# Patient Record
Sex: Female | Born: 1990 | Hispanic: Yes | Smoking: Never smoker
Health system: Southern US, Community
[De-identification: ages and names within clinical notes are randomized; demographics above are authoritative.]

## PROBLEM LIST (undated history)

## (undated) ENCOUNTER — Inpatient Hospital Stay (HOSPITAL_COMMUNITY): Payer: Self-pay

## (undated) DIAGNOSIS — E559 Vitamin D deficiency, unspecified: Secondary | ICD-10-CM

## (undated) DIAGNOSIS — Z789 Other specified health status: Secondary | ICD-10-CM

## (undated) DIAGNOSIS — E785 Hyperlipidemia, unspecified: Secondary | ICD-10-CM

## (undated) DIAGNOSIS — R519 Headache, unspecified: Secondary | ICD-10-CM

## (undated) HISTORY — DX: Vitamin D deficiency, unspecified: E55.9

## (undated) HISTORY — DX: Other specified health status: Z78.9

## (undated) HISTORY — DX: Headache, unspecified: R51.9

---
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
## 2011-07-06 ENCOUNTER — Other Ambulatory Visit: Payer: Self-pay | Admitting: Geriatric Medicine

## 2011-07-06 DIAGNOSIS — R109 Unspecified abdominal pain: Secondary | ICD-10-CM

## 2011-07-10 ENCOUNTER — Ambulatory Visit
Admission: RE | Admit: 2011-07-10 | Discharge: 2011-07-10 | Disposition: A | Discharge status: Home / Self Care | Payer: No Typology Code available for payment source | Source: Ambulatory Visit | Attending: Geriatric Medicine | Admitting: Geriatric Medicine

## 2011-07-10 DIAGNOSIS — R109 Unspecified abdominal pain: Secondary | ICD-10-CM

## 2012-04-29 ENCOUNTER — Encounter (HOSPITAL_COMMUNITY): Payer: Self-pay | Admitting: *Deleted

## 2012-04-29 ENCOUNTER — Inpatient Hospital Stay (HOSPITAL_COMMUNITY)
Admission: AD | Admit: 2012-04-29 | Discharge: 2012-04-29 | Disposition: A | Discharge status: Home / Self Care | Payer: Self-pay | Source: Ambulatory Visit | Attending: Obstetrics & Gynecology | Admitting: Obstetrics & Gynecology

## 2012-04-29 DIAGNOSIS — O209 Hemorrhage in early pregnancy, unspecified: Secondary | ICD-10-CM | POA: Insufficient documentation

## 2012-04-29 DIAGNOSIS — O43899 Other placental disorders, unspecified trimester: Secondary | ICD-10-CM

## 2012-04-29 DIAGNOSIS — O418X90 Other specified disorders of amniotic fluid and membranes, unspecified trimester, not applicable or unspecified: Secondary | ICD-10-CM

## 2012-04-29 LAB — WET PREP, GENITAL
Clue Cells Wet Prep HPF POC: NONE SEEN
Trich, Wet Prep: NONE SEEN
Yeast Wet Prep HPF POC: NONE SEEN

## 2012-04-29 MED ORDER — PRENATAL VITAMINS (DIS) PO TABS: 1.0000 | ORAL_TABLET | Freq: Every day | ORAL | Status: DC

## 2012-04-29 NOTE — MAU Note (Signed)
Pt had two large clots this morning when wiping, and is currently having bright red bleeding.

## 2012-04-29 NOTE — MAU Provider Note (Signed)
Attestation of Attending Supervision of Advanced Practitioner (CNM/NP): Evaluation and management procedures were performed by the Advanced Practitioner under my supervision and collaboration.  I have reviewed the Advanced Practitioner's note and chart, and I agree with the management and plan.  HARRAWAY-SMITH, Modupe Shampine 3:33 PM     

## 2012-04-29 NOTE — MAU Provider Note (Signed)
Chief Complaint:  Vaginal Bleeding    First Provider Initiated Contact with Patient 04/29/12 1024      [name] [name] is  21 y.o. G2P0010.  No LMP recorded. Patient is pregnant..  Her pregnancy status is positive.  [redacted]w[redacted]d She presents complaining of Vaginal Bleeding . Onset is described as sudden and has been present for  2 hours. Reports passing 2 quarter sized clots this morning and having continued light spotting. Reports mild lower abd cramping with passage of clots, none since. Starts PNC at [facility] next week. Last intercourse yesterday.  Obstetrical/Gynecological History: OB History    Grav Para Term Preterm Abortions TAB SAB Ect Mult Living   2 0 0 0 1  1   0      Past Medical History: History reviewed. No pertinent past medical history.  Past Surgical History: Past Surgical History  Procedure Date  . Dilation and curettage of uterus     Family History: History reviewed. No pertinent family history.  Social History: History  Substance Use Topics  . Smoking status: Never Smoker   . Smokeless tobacco: Not on file  . Alcohol Use: No    Allergies: No Known Allergies  No prescriptions prior to admission    Review of Systems - History obtained from the patient and through interpreptor, [name] [name] General ROS: negative for - chills or fever Breast ROS: negative Respiratory ROS: no cough, shortness of breath, or wheezing Cardiovascular ROS: no chest pain or dyspnea on exertion Gastrointestinal ROS: no abdominal pain, change in bowel habits, or black or bloody stools Genito-Urinary ROS: no dysuria, trouble voiding, or hematuria positive for - vaginal bleeding  Physical Exam   There were no vitals taken for this visit.  General: General appearance - alert, well appearing, and in no distress, oriented to person, place, and time and normal appearing weight Mental status - alert, oriented to person, place, and time, normal mood, behavior, speech, dress, motor activity,  and thought processes, affect appropriate to mood, comfortable appearing Abdomen - soft, nontender, nondistended, no masses or organomegaly Musculoskeletal - no joint tenderness, deformity or swelling, full range of motion without pain Extremities - peripheral pulses normal, no pedal edema, no clubbing or cyanosis Focused Gynecological Exam: VULVA: normal appearing vulva with no masses, tenderness or lesions, VAGINA: vaginal discharge - bloody, mucoid and scant, CERVIX: normal appearing cervix without discharge or lesions, closed/long/thick, UTERUS: enlarged to 12 week's size, ADNEXA: normal adnexa in size, nontender and no masses  Labs: Recent Results (from the past 24 hour(s))  WET PREP, GENITAL   Collection Time   04/29/12 10:35 AM      Component Value Range   Yeast Wet Prep HPF POC NONE SEEN  NONE SEEN   Trich, Wet Prep NONE SEEN  NONE SEEN   Clue Cells Wet Prep HPF POC NONE SEEN  NONE SEEN   WBC, Wet Prep HPF POC FEW (*) NONE SEEN   Imaging Studies:  Informal bedside US: + IUP with CRL c/w [redacted]w[redacted]d. Small SCH note 2.91cm in size. + Cardiac activity and fetal movement noted  Assessment: 1. Subchorionic hematoma, antepartum   2. Bleeding in early pregnancy     Plan: Discharge home Bleeding precautions FU as scheduled in [facility] next week.  [name] [name] E. 04/29/2012,11:13 AM

## 2012-04-30 LAB — GC/CHLAMYDIA PROBE AMP, GENITAL
Chlamydia, DNA Probe: NEGATIVE
GC Probe Amp, Genital: NEGATIVE

## 2012-05-16 ENCOUNTER — Other Ambulatory Visit (HOSPITAL_COMMUNITY): Payer: Self-pay | Admitting: Physician Assistant

## 2012-05-16 DIAGNOSIS — Z0489 Encounter for examination and observation for other specified reasons: Secondary | ICD-10-CM

## 2012-05-16 LAB — OB RESULTS CONSOLE RPR: RPR: NONREACTIVE

## 2012-05-16 LAB — OB RESULTS CONSOLE GC/CHLAMYDIA: Gonorrhea: NEGATIVE

## 2012-05-16 LAB — OB RESULTS CONSOLE ABO/RH: RH Type: POSITIVE

## 2012-06-06 ENCOUNTER — Ambulatory Visit (HOSPITAL_COMMUNITY)
Admission: RE | Admit: 2012-06-06 | Discharge: 2012-06-06 | Disposition: A | Discharge status: Home / Self Care | Payer: Medicaid Other | Source: Ambulatory Visit | Attending: Physician Assistant | Admitting: Physician Assistant

## 2012-06-06 ENCOUNTER — Other Ambulatory Visit (HOSPITAL_COMMUNITY): Payer: Self-pay | Admitting: Physician Assistant

## 2012-06-06 DIAGNOSIS — Z0489 Encounter for examination and observation for other specified reasons: Secondary | ICD-10-CM

## 2012-06-06 DIAGNOSIS — O358XX0 Maternal care for other (suspected) fetal abnormality and damage, not applicable or unspecified: Secondary | ICD-10-CM | POA: Insufficient documentation

## 2012-06-06 DIAGNOSIS — Z1389 Encounter for screening for other disorder: Secondary | ICD-10-CM | POA: Insufficient documentation

## 2012-06-06 DIAGNOSIS — Z363 Encounter for antenatal screening for malformations: Secondary | ICD-10-CM | POA: Insufficient documentation

## 2012-09-07 NOTE — L&D Delivery Note (Signed)
Delivery Note Pt progressed to complete and pushed well and at 4:42 PM a viable female was delivered via Vaginal, Spontaneous Delivery (Presentation: ROA  ).  APGAR: 9, ; weight: pending.  Pt dried and placed on pt's abd. Cord clamped and cut by FOB. Hospital cord blood sample collected. Placenta status: Intact, Spontaneous.  Cord: 3 vessels  Anesthesia: Epidural  Episiotomy: None Lacerations: 1st degree;Perineal Suture Repair: 3.0 vicryl Est. Blood Loss (mL): 350cc  Mom to postpartum.  Baby to nursery-stable.  Cam Hai 11/12/2012, 5:01 PM

## 2012-09-07 NOTE — L&D Delivery Note (Signed)
Attestation of Attending Supervision of Advanced Practitioner (CNM/NP): Evaluation and management procedures were performed by the Advanced Practitioner under my supervision and collaboration.  I have reviewed the Advanced Practitioner's note and chart, and I agree with the management and plan.  Tabytha Gradillas 11/16/2012 11:05 AM   

## 2012-09-29 ENCOUNTER — Other Ambulatory Visit (HOSPITAL_COMMUNITY): Payer: Self-pay | Admitting: Physician Assistant

## 2012-09-29 DIAGNOSIS — O365990 Maternal care for other known or suspected poor fetal growth, unspecified trimester, not applicable or unspecified: Secondary | ICD-10-CM

## 2012-09-30 ENCOUNTER — Ambulatory Visit (HOSPITAL_COMMUNITY)
Admission: RE | Admit: 2012-09-30 | Discharge: 2012-09-30 | Disposition: A | Discharge status: Home / Self Care | Payer: Self-pay | Source: Ambulatory Visit | Attending: Physician Assistant | Admitting: Physician Assistant

## 2012-09-30 DIAGNOSIS — O365990 Maternal care for other known or suspected poor fetal growth, unspecified trimester, not applicable or unspecified: Secondary | ICD-10-CM | POA: Insufficient documentation

## 2012-09-30 DIAGNOSIS — O26849 Uterine size-date discrepancy, unspecified trimester: Secondary | ICD-10-CM | POA: Insufficient documentation

## 2012-10-04 ENCOUNTER — Other Ambulatory Visit (HOSPITAL_COMMUNITY): Payer: Self-pay | Admitting: Physician Assistant

## 2012-10-04 DIAGNOSIS — O4190X0 Disorder of amniotic fluid and membranes, unspecified, unspecified trimester, not applicable or unspecified: Secondary | ICD-10-CM

## 2012-10-06 ENCOUNTER — Ambulatory Visit (HOSPITAL_COMMUNITY): Payer: Self-pay

## 2012-10-15 LAB — OB RESULTS CONSOLE GBS: GBS: NEGATIVE

## 2012-11-08 ENCOUNTER — Telehealth (HOSPITAL_COMMUNITY): Payer: Self-pay | Admitting: *Deleted

## 2012-11-08 ENCOUNTER — Encounter (HOSPITAL_COMMUNITY): Payer: Self-pay | Admitting: *Deleted

## 2012-11-08 NOTE — Telephone Encounter (Signed)
Preadmission screen Interpreter number [phone]

## 2012-11-09 ENCOUNTER — Telehealth (HOSPITAL_COMMUNITY): Payer: Self-pay | Admitting: *Deleted

## 2012-11-09 NOTE — Telephone Encounter (Signed)
Preadmission screen  

## 2012-11-11 ENCOUNTER — Other Ambulatory Visit: Payer: Medicaid Other

## 2012-11-12 ENCOUNTER — Inpatient Hospital Stay (HOSPITAL_COMMUNITY)
Admission: AD | Admit: 2012-11-12 | Discharge: 2012-11-14 | DRG: 775 | Disposition: A | Discharge status: Home / Self Care | Payer: Medicaid Other | Source: Ambulatory Visit | Attending: Obstetrics and Gynecology | Admitting: Obstetrics and Gynecology

## 2012-11-12 ENCOUNTER — Inpatient Hospital Stay (HOSPITAL_COMMUNITY): Payer: Medicaid Other | Admitting: Anesthesiology

## 2012-11-12 ENCOUNTER — Encounter (HOSPITAL_COMMUNITY): Payer: Self-pay | Admitting: *Deleted

## 2012-11-12 ENCOUNTER — Encounter (HOSPITAL_COMMUNITY): Payer: Self-pay | Admitting: Anesthesiology

## 2012-11-12 DIAGNOSIS — O365990 Maternal care for other known or suspected poor fetal growth, unspecified trimester, not applicable or unspecified: Secondary | ICD-10-CM | POA: Diagnosis present

## 2012-11-12 LAB — CBC
HCT: 35.4 % — ABNORMAL LOW (ref 36.0–46.0)
Hemoglobin: 12.1 g/dL (ref 12.0–15.0)
MCH: 32.4 pg (ref 26.0–34.0)
MCHC: 34.2 g/dL (ref 30.0–36.0)
MCV: 94.7 fL (ref 78.0–100.0)
RBC: 3.74 MIL/uL — ABNORMAL LOW (ref 3.87–5.11)

## 2012-11-12 LAB — ABO/RH: ABO/RH(D): A POS

## 2012-11-12 MED ORDER — OXYTOCIN BOLUS FROM INFUSION: 500.0000 mL | INTRAVENOUS | Status: DC

## 2012-11-12 MED ORDER — SIMETHICONE 80 MG PO CHEW: 80.0000 mg | CHEWABLE_TABLET | ORAL | Status: DC | PRN

## 2012-11-12 MED ORDER — CITRIC ACID-SODIUM CITRATE 334-500 MG/5ML PO SOLN: 30.0000 mL | ORAL | Status: DC | PRN

## 2012-11-12 MED ORDER — ACETAMINOPHEN 325 MG PO TABS: 650.0000 mg | ORAL_TABLET | ORAL | Status: DC | PRN

## 2012-11-12 MED ORDER — WITCH HAZEL-GLYCERIN EX PADS: 1.0000 "application " | MEDICATED_PAD | CUTANEOUS | Status: DC | PRN

## 2012-11-12 MED ORDER — SODIUM BICARBONATE 8.4 % IV SOLN
INTRAVENOUS | Status: DC | PRN
  Administered 2012-11-12: 4 mL via EPIDURAL

## 2012-11-12 MED ORDER — OXYCODONE-ACETAMINOPHEN 5-325 MG PO TABS: 1.0000 | ORAL_TABLET | ORAL | Status: DC | PRN

## 2012-11-12 MED ORDER — FENTANYL 2.5 MCG/ML BUPIVACAINE 1/10 % EPIDURAL INFUSION (WH - ANES)
14.0000 mL/h | INTRAMUSCULAR | Status: DC
  Administered 2012-11-12: 12 mL/h via EPIDURAL
  Filled 2012-11-12: qty 125

## 2012-11-12 MED ORDER — LIDOCAINE HCL (PF) 1 % IJ SOLN
30.0000 mL | INTRAMUSCULAR | Status: DC | PRN
  Filled 2012-11-12: qty 30

## 2012-11-12 MED ORDER — LACTATED RINGERS IV SOLN
500.0000 mL | Freq: Once | INTRAVENOUS | Status: AC
  Administered 2012-11-12: 500 mL via INTRAVENOUS

## 2012-11-12 MED ORDER — BENZOCAINE-MENTHOL 20-0.5 % EX AERO: 1.0000 "application " | INHALATION_SPRAY | CUTANEOUS | Status: DC | PRN

## 2012-11-12 MED ORDER — ONDANSETRON HCL 4 MG/2ML IJ SOLN: 4.0000 mg | Freq: Four times a day (QID) | INTRAMUSCULAR | Status: DC | PRN

## 2012-11-12 MED ORDER — PHENYLEPHRINE 40 MCG/ML (10ML) SYRINGE FOR IV PUSH (FOR BLOOD PRESSURE SUPPORT): 80.0000 ug | PREFILLED_SYRINGE | INTRAVENOUS | Status: DC | PRN

## 2012-11-12 MED ORDER — DIPHENHYDRAMINE HCL 50 MG/ML IJ SOLN: 12.5000 mg | INTRAMUSCULAR | Status: DC | PRN

## 2012-11-12 MED ORDER — LANOLIN HYDROUS EX OINT: TOPICAL_OINTMENT | CUTANEOUS | Status: DC | PRN

## 2012-11-12 MED ORDER — LACTATED RINGERS IV SOLN
INTRAVENOUS | Status: DC
  Administered 2012-11-12: 13:00:00 via INTRAVENOUS

## 2012-11-12 MED ORDER — ZOLPIDEM TARTRATE 5 MG PO TABS: 5.0000 mg | ORAL_TABLET | Freq: Every evening | ORAL | Status: DC | PRN

## 2012-11-12 MED ORDER — PHENYLEPHRINE 40 MCG/ML (10ML) SYRINGE FOR IV PUSH (FOR BLOOD PRESSURE SUPPORT)
80.0000 ug | PREFILLED_SYRINGE | INTRAVENOUS | Status: DC | PRN
  Filled 2012-11-12: qty 5

## 2012-11-12 MED ORDER — TETANUS-DIPHTH-ACELL PERTUSSIS 5-2.5-18.5 LF-MCG/0.5 IM SUSP: 0.5000 mL | Freq: Once | INTRAMUSCULAR | Status: DC

## 2012-11-12 MED ORDER — EPHEDRINE 5 MG/ML INJ: 10.0000 mg | INTRAVENOUS | Status: DC | PRN

## 2012-11-12 MED ORDER — SENNOSIDES-DOCUSATE SODIUM 8.6-50 MG PO TABS
2.0000 | ORAL_TABLET | Freq: Every day | ORAL | Status: DC
  Administered 2012-11-12: 2 via ORAL

## 2012-11-12 MED ORDER — EPHEDRINE 5 MG/ML INJ
10.0000 mg | INTRAVENOUS | Status: DC | PRN
  Filled 2012-11-12: qty 4

## 2012-11-12 MED ORDER — FLEET ENEMA 7-19 GM/118ML RE ENEM: 1.0000 | ENEMA | RECTAL | Status: DC | PRN

## 2012-11-12 MED ORDER — PRENATAL MULTIVITAMIN CH
1.0000 | ORAL_TABLET | Freq: Every day | ORAL | Status: DC
  Administered 2012-11-13 – 2012-11-14 (×2): 1 via ORAL
  Filled 2012-11-12 (×2): qty 1

## 2012-11-12 MED ORDER — ONDANSETRON HCL 4 MG PO TABS: 4.0000 mg | ORAL_TABLET | ORAL | Status: DC | PRN

## 2012-11-12 MED ORDER — IBUPROFEN 600 MG PO TABS
600.0000 mg | ORAL_TABLET | Freq: Four times a day (QID) | ORAL | Status: DC
  Administered 2012-11-12 – 2012-11-14 (×8): 600 mg via ORAL
  Filled 2012-11-12 (×8): qty 1

## 2012-11-12 MED ORDER — ONDANSETRON HCL 4 MG/2ML IJ SOLN: 4.0000 mg | INTRAMUSCULAR | Status: DC | PRN

## 2012-11-12 MED ORDER — IBUPROFEN 600 MG PO TABS: 600.0000 mg | ORAL_TABLET | Freq: Four times a day (QID) | ORAL | Status: DC | PRN

## 2012-11-12 MED ORDER — OXYTOCIN 40 UNITS IN LACTATED RINGERS INFUSION - SIMPLE MED
62.5000 mL/h | INTRAVENOUS | Status: DC
  Administered 2012-11-12: 62.5 mL/h via INTRAVENOUS
  Filled 2012-11-12: qty 1000

## 2012-11-12 MED ORDER — DIPHENHYDRAMINE HCL 25 MG PO CAPS: 25.0000 mg | ORAL_CAPSULE | Freq: Four times a day (QID) | ORAL | Status: DC | PRN

## 2012-11-12 MED ORDER — DIBUCAINE 1 % RE OINT: 1.0000 "application " | TOPICAL_OINTMENT | RECTAL | Status: DC | PRN

## 2012-11-12 MED ORDER — LACTATED RINGERS IV SOLN: 500.0000 mL | INTRAVENOUS | Status: DC | PRN

## 2012-11-12 NOTE — Progress Notes (Signed)
Lab in to redraw Type&Screen. Previous sample hemolyzed.

## 2012-11-12 NOTE — H&P (Signed)
[name] [name] is a 22 y.o. female G2P0010 at 41.0 presenting for contractions and leaking fluid. Denies fever, chills, nausea, vomiting, headache. Is having some bloody, watery vaginal discharge she is concerned is her membranes rupturing.  [facility] Pregnancy uncomplicated except for size less than dates, ultrasound on 09/30/12 at 32 weeks showed growth to be in 48% with no follow up needed.    Maternal Medical History:  Reason for admission: Contractions.  Nausea.  Contractions: Onset was 3-5 hours ago.   Frequency: regular.   Duration is approximately 1 minute.   Perceived severity is moderate.    Fetal activity: Perceived fetal activity is normal.   Last perceived fetal movement was within the past hour.    Prenatal complications: No hypertension, pre-eclampsia, preterm labor or substance abuse.   Prenatal Complications - Diabetes: none.    OB History   Grav Para Term Preterm Abortions TAB SAB Ect Mult Living   2 0 0 0 1  1   0     Past Medical History  Diagnosis Date  . Medical history non-contributory    Past Surgical History  Procedure Laterality Date  . Dilation and curettage of uterus     Family History: family history includes Heart disease in her father. Social History:  reports that she has never smoked. She has never used smokeless tobacco. She reports that she does not drink alcohol or use illicit drugs.   Prenatal Transfer Tool  Maternal Diabetes: No Genetic Screening: Normal Maternal Ultrasounds/Referrals: Normal Fetal Ultrasounds or other Referrals:  None Maternal Substance Abuse:  No Significant Maternal Medications:  None Significant Maternal Lab Results:  Lab values include: Group B Strep negative Other Comments:  ultrasound at 32 weeks showed growth in 48th percentile  Review of Systems  Constitutional: Negative for fever and chills.  Eyes: Negative for blurred vision.  Gastrointestinal: Positive for  abdominal pain. Negative for nausea and vomiting.  Genitourinary: Negative for dysuria.  Neurological: Negative for headaches.    Dilation: 4 Effacement (%): 100 Station: -1 Exam by:: [employer] CNM student Blood pressure 125/63, pulse 84, temperature 97.2 F (36.2 C), temperature source Oral, resp. rate 18, height 5' (1.524 m), weight 54.25 kg (119 lb 9.6 oz). Maternal Exam:  Uterine Assessment: Contraction strength is moderate.  Contraction duration is 1 minute. Contraction frequency is regular.   Abdomen: Fetal presentation: vertex  Introitus: Vagina is positive for vaginal discharge.  Ferning test: negative.   Pelvis: adequate for delivery.   Cervix: Cervix evaluated by sterile speculum exam and digital exam.     Fetal Exam Fetal Monitor Review: Mode: ultrasound.   Baseline rate: 135.  Variability: moderate (6-25 bpm).   Pattern: accelerations present.    Fetal State Assessment: Category I - tracings are normal.     Physical Exam  Constitutional: She is oriented to person, place, and time. She appears well-developed and well-nourished. She appears distressed.  Cardiovascular: Normal rate and regular rhythm.   Respiratory: Effort normal and breath sounds normal. No respiratory distress.  GI: Soft.  Genitourinary: Vaginal discharge found.  4/100/-1   Musculoskeletal: Normal range of motion. She exhibits no edema.  Neurological: She is alert and oriented to person, place, and time.  Psychiatric: She has a normal mood and affect. Her behavior is normal.    Prenatal labs: ABO, Rh: A/Positive/-- (09/09 0000) Antibody: Negative (09/09 0000) Rubella: Immune (09/09 0000) RPR: Nonreactive (09/09 0000)  HBsAg: Negative (09/09 0000)  HIV: Non-reactive (09/09  0000)  GBS: Negative (02/08 0000)   Assessment/Plan: 1. G2P0010 at 41.0 in active labor 2. GBS negative 3. Membranes intact with normal bloody show  -admit to labor and delivery -may have epidural as  desired    Alene Mires 11/12/2012, 8:04 AM  I have seen and examined this patient and I agree with the above. SHAW, KIMBERLY 11:06 AM 11/12/2012

## 2012-11-12 NOTE — Anesthesia Procedure Notes (Signed)
Epidural Patient location during procedure: OB  Preanesthetic Checklist Completed: patient identified, site marked, surgical consent, pre-op evaluation, timeout performed, IV checked, risks and benefits discussed and monitors and equipment checked  Epidural Patient position: sitting Prep: site prepped and draped and DuraPrep Patient monitoring: continuous pulse ox and blood pressure Approach: midline Injection technique: LOR air  Needle:  Needle type: Tuohy  Needle gauge: 17 G Needle length: 9 cm and 9 Needle insertion depth: 4 cm Catheter type: closed end flexible Catheter size: 19 Gauge Catheter at skin depth: 10 cm Test dose: negative  Assessment Events: blood not aspirated, injection not painful, no injection resistance, negative IV test and no paresthesia  Additional Notes Dosing of Epidural:  1st dose, through catheter ............................................. epi 1:200K + Xylocaine 40 mg  2nd dose, through catheter, after waiting 3 minutes.....epi 1:200K + Xylocaine 40 mg   ( 2% Xylo charted as a single dose in Epic Meds for ease of charting; actual dosing was fractionated as above, for saftey's sake)  As each dose occurred, patient was free of IV sx; and patient exhibited no evidence of SA injection.  Patient is more comfortable after epidural dosed. Please see RN's note for documentation of vital signs,and FHR which are stable.  Patient reminded not to try to ambulate with numb legs, and that an RN must be present the 1st time she attempts to get up.    

## 2012-11-12 NOTE — Progress Notes (Signed)
[name] [name] is a 22 y.o. G2P0010 at [redacted]w[redacted]d   Subjective: Comfortable with epidural but feeling a little pressure  Objective: BP 103/59  Pulse 87  Temp(Src) 98 F (36.7 C) (Oral)  Resp 20  Ht 5' (1.524 m)  Wt 119 lb 9.6 oz (54.25 kg)  BMI 23.36 kg/m2  SpO2 100%      FHT:  FHR: 125 bpm, variability: moderate,  accelerations:  Present,  decelerations:  Absent UC:   regular, every 3-4 minutes, spontaneously SVE:   Dilation: 9 Effacement (%): 100 Station: +1 Exam by:: J.Cox, RN  Labs: Lab Results  Component Value Date   WBC 8.8 11/12/2012   HGB 12.1 11/12/2012   HCT 35.4* 11/12/2012   MCV 94.7 11/12/2012   PLT 167 11/12/2012    Assessment / Plan: Active labor/transiton  Will reexamine cx in 1-2 hours or sooner with increased pressure  SHAW, KIMBERLY 11/12/2012, 2:38 PM

## 2012-11-12 NOTE — MAU Note (Signed)
Pt presents with complaints or leakage of fluid and contractions that started at 6am. States contractions are 5 mins apart.

## 2012-11-12 NOTE — Anesthesia Preprocedure Evaluation (Addendum)

## 2012-11-13 NOTE — Anesthesia Postprocedure Evaluation (Signed)
Anesthesia Post Note  Patient: @[name] [name] @[name] [name]   Procedure(s) Performed: CLE/C/S  Anesthesia type: Epidural  Patient location: Mother/Baby  Post pain: Pain level controlled  Post assessment: Post-op Vital signs reviewed  Last Vitals: BP 105/55  Pulse 67  Temp(Src) 36.8 C (Oral)  Resp 18  Ht 5' (1.524 m)  Wt 119 lb 9.6 oz (54.25 kg)  BMI 23.36 kg/m2  SpO2 100%  Post vital signs: Reviewed  Level of consciousness: awake  Complications: No apparent anesthesia complications

## 2012-11-13 NOTE — Discharge Summary (Signed)
Obstetric Discharge Summary Reason for Admission: onset of labor Prenatal Procedures: ultrasound Intrapartum Procedures: spontaneous vaginal delivery Postpartum Procedures: none Complications-Operative and Postpartum: none Hemoglobin  Date Value Range Status  11/12/2012 12.1  12.0 - 15.0 g/dL Final     HCT  Date Value Range Status  11/12/2012 35.4* 36.0 - 46.0 % Final    Physical Exam:  General: alert, cooperative and no distress Lochia: appropriate Uterine Fundus: firm DVT Evaluation: No evidence of DVT seen on physical exam.  Discharge Diagnoses: Term Pregnancy-delivered  Discharge Information: Date: 11/13/2012 Activity: pelvic rest Diet: routine Medications: None Condition: stable Instructions: refer to practice specific booklet Discharge to: home   Newborn Data: Live born female  Birth Weight: 6 lb 12.1 oz (3065 g) APGAR: 9, 9  Home with mother.  [name] [name],[name] Roller 11/13/2012, 9:44 AM

## 2012-11-14 ENCOUNTER — Other Ambulatory Visit: Payer: Medicaid Other

## 2012-11-14 LAB — RPR: RPR Ser Ql: NONREACTIVE

## 2012-11-14 MED ORDER — OXYCODONE-ACETAMINOPHEN 5-325 MG PO TABS: 1.0000 | ORAL_TABLET | ORAL | Status: DC | PRN

## 2012-11-14 MED ORDER — NORETHINDRONE 0.35 MG PO TABS: 1.0000 | ORAL_TABLET | Freq: Every day | ORAL | Status: DC

## 2012-11-14 NOTE — Progress Notes (Signed)
UR chart review completed.  

## 2012-11-14 NOTE — Lactation Note (Signed)
This note was copied from the chart of [name] [name]. Mom states br feeding is going very well. Mom denies pain. Mom currently feeding baby without assistance, LS = 10. Instructed mom and FOB in the prevention and treatment of engorgement and sore nipples. Enc mom to call lactation office if she has any concerns, and to attend the BFSG. Questions answered. Debbie, interpreter, present for consult.

## 2012-11-14 NOTE — Progress Notes (Signed)
Post Partum Day 2  Subjective: Interview conducted with Spanish interpreter no complaints, up ad lib, voiding, tolerating PO, + flatus and bowel movements. Plans on using OCPs for contraception, would like for [place] to prescribe. [facility] Breastfeeding.  Objective: Blood pressure 101/67, pulse 77, temperature 98.2 F (36.8 C), temperature source Oral, resp. rate 18, height 5' (1.524 m), weight 54.25 kg (119 lb 9.6 oz), SpO2 100.00%, unknown if currently breastfeeding.  Physical Exam:  General: alert, cooperative, appears stated age, no distress and pleasant Uterine Fundus: firm DVT Evaluation: No evidence of DVT seen on physical exam. Negative Homan's sign. No cords or calf tenderness. Heart and lungs clear to auscultation   Recent Labs  11/12/12 0835  HGB 12.1  HCT 35.4*    Assessment/Plan: Discharge home Prescription for progesterone-only OCPs sent.   LOS: 2 days   Lorna Dibble, PA-S 11/14/2012, 7:58 AM

## 2012-11-15 ENCOUNTER — Inpatient Hospital Stay (HOSPITAL_COMMUNITY): Admission: RE | Admit: 2012-11-15 | Payer: Medicaid Other | Source: Ambulatory Visit

## 2012-11-17 ENCOUNTER — Ambulatory Visit (HOSPITAL_COMMUNITY): Admission: RE | Admit: 2012-11-17 | Payer: MEDICAID | Source: Ambulatory Visit

## 2012-12-18 IMAGING — US US OB DETAIL+14 WK
2 series · 12 of 28 positions shown · non-contrast
Comparison: none

[Series 1: us ob detail +14 wk · 96 acquisitions, 11 frames shown (1 of 2)]
[im 4/96]
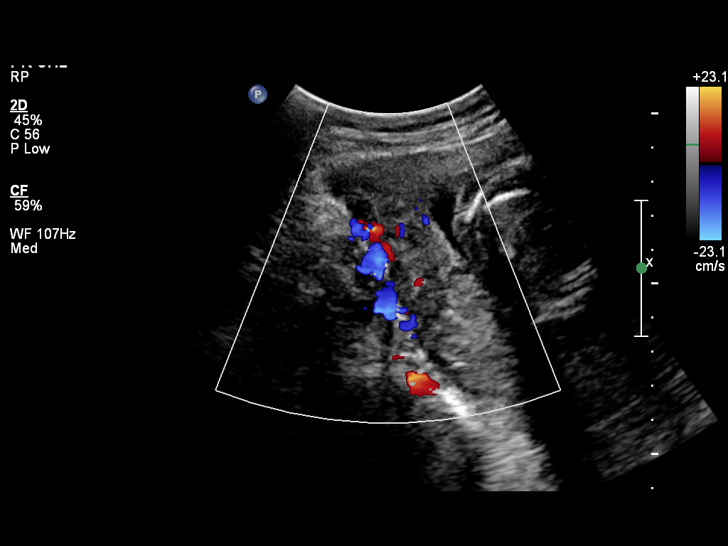
[im 12/96]
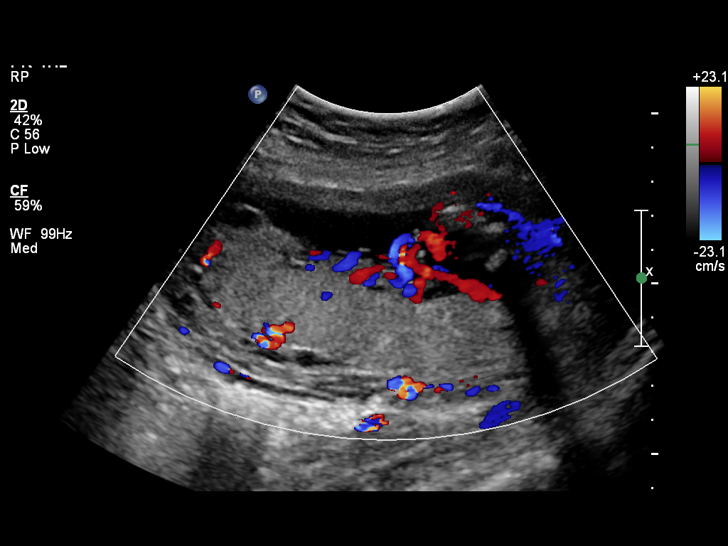
[im 20/96]
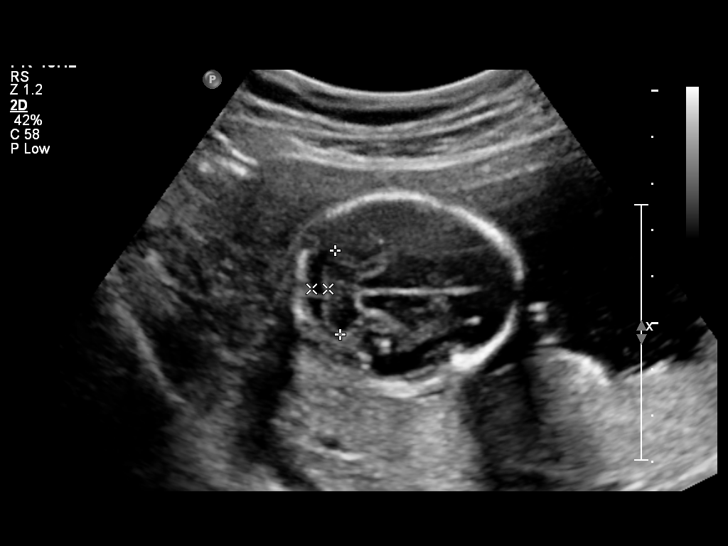
[im 31/96]
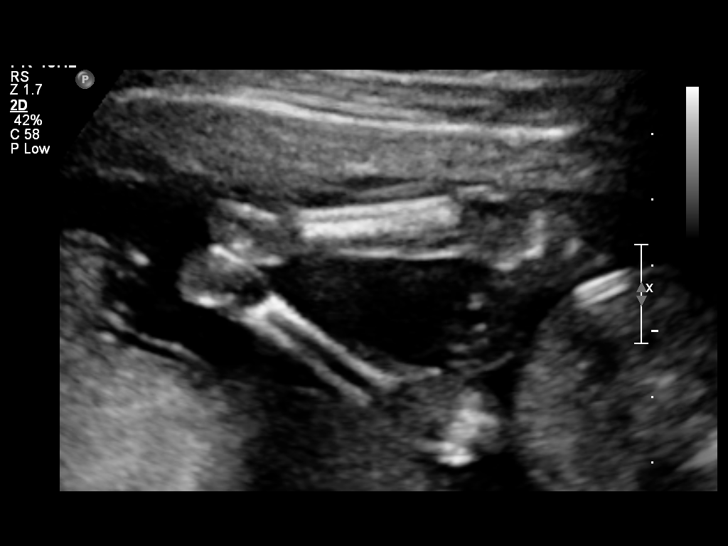
[im 39/96]
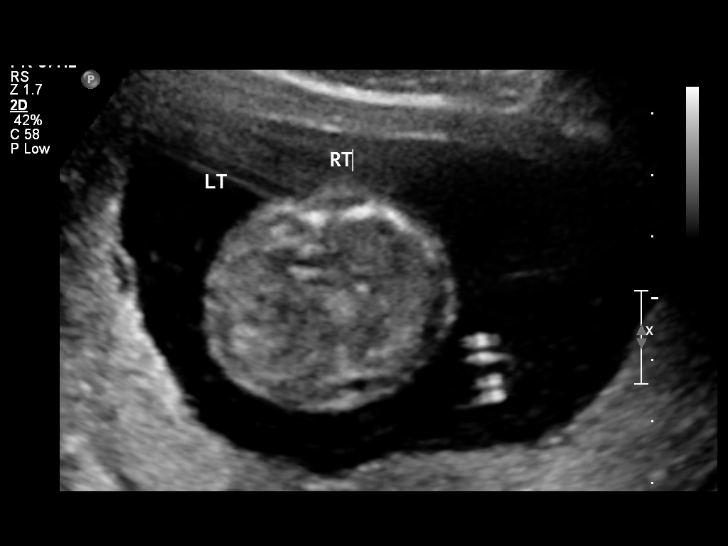
[im 46/96]
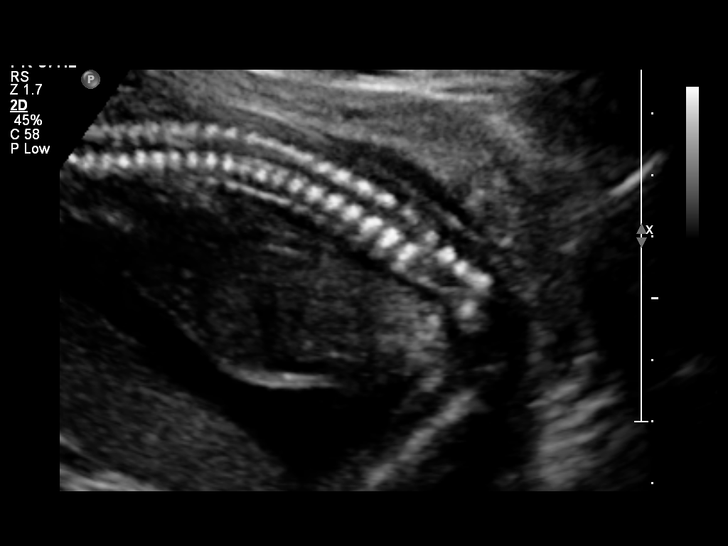
[im 58/96]
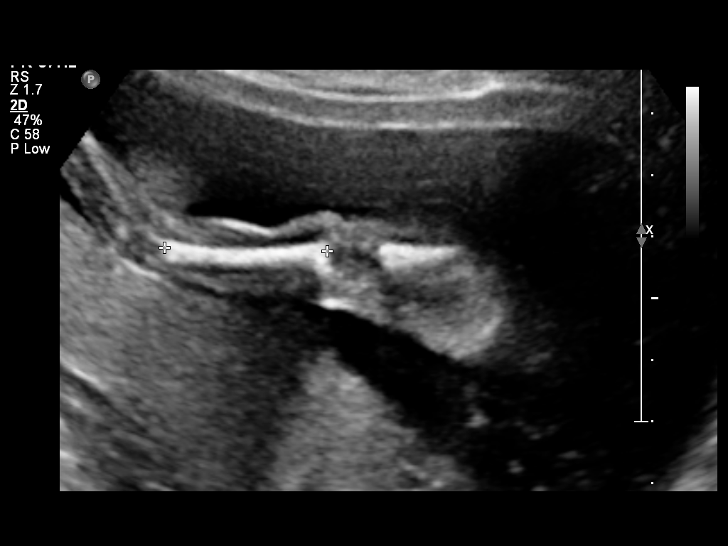
[im 65/96]
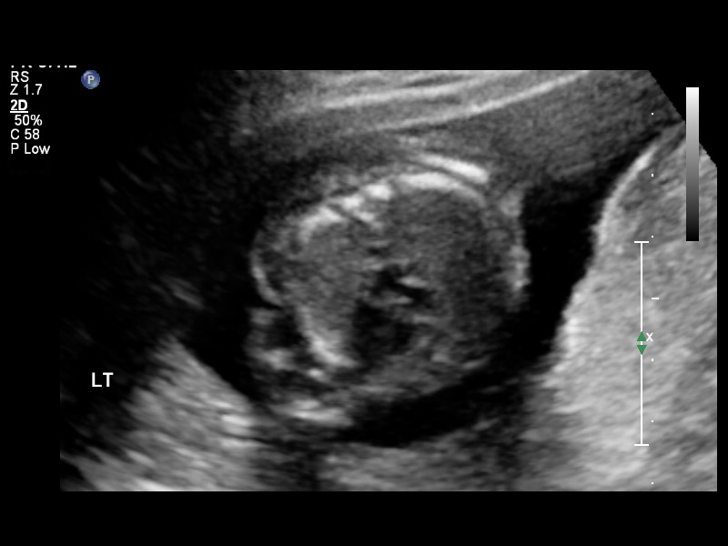
[im 73/96]
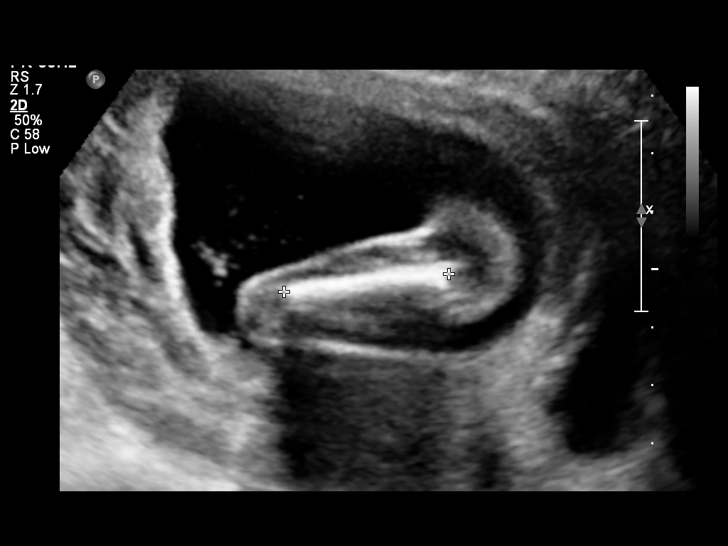
[im 84/96]
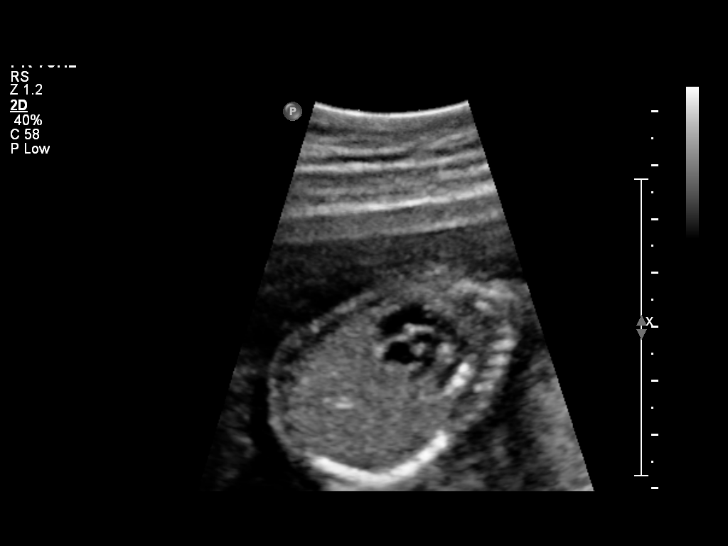
[im 92/96]
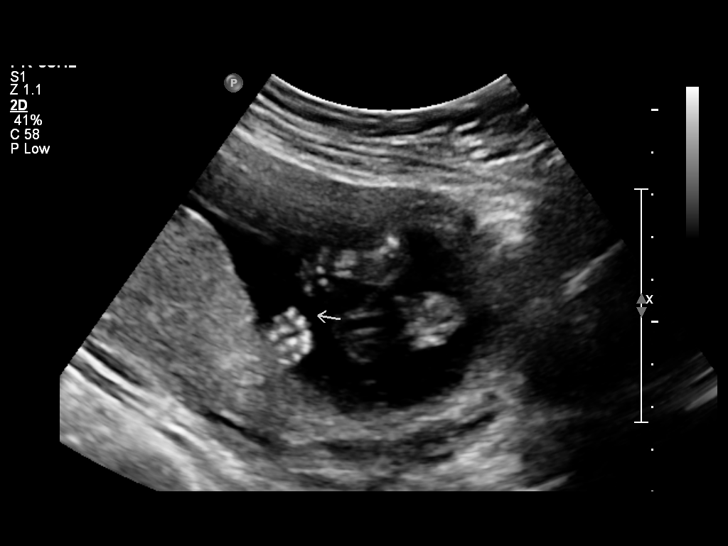

[Series 1: us ob detail +14 wk · 1 of 8 slices shown (2 of 2)]
[im 1/8]
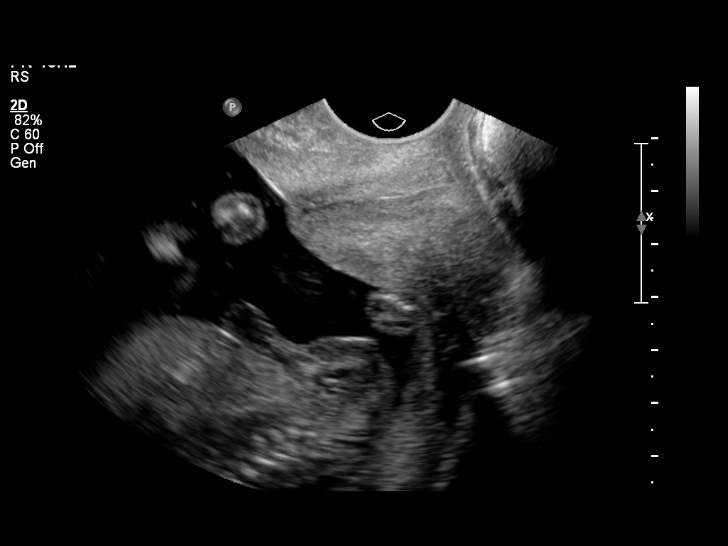

[12 of 28 positions shown; findings below may reference images not displayed]

OBSTETRICS REPORT
                    (Corrected Final 06/13/2012 [DATE])

Service(s) Provided

 US OB DETAIL + 14 WK                                  76811.0
 US OB TRANSVAGINAL                                    76817.0
Indications

 Detailed fetal anatomic survey

Fetal Evaluation

 Num Of Fetuses:    1
 Preg. Location:    Intrauterine
 Fetal Heart Rate:  139                          bpm
 Cardiac Activity:  Observed
 Presentation:      Breech
 Placenta:          Posterior, above cervical
                    os
 P. Cord            Visualized, central
 Insertion:

 Amniotic Fluid
 AFI FV:      Subjectively within normal limits
                                             Larg Pckt:     5.9  cm
Biometry

 BPD:     40.6  mm     G. Age:  18w 2d                CI:        75.74   70 - 86
                                                      FL/HC:      19.1   15.8 -
                                                                         18
 HC:     147.9  mm     G. Age:  17w 6d       24  %    HC/AC:      1.10   1.07 -

 AC:     133.9  mm     G. Age:  18w 6d       66  %    FL/BPD:
 FL:      28.3  mm     G. Age:  18w 5d       58  %    FL/AC:      21.1   20 - 24
 HUM:     27.1  mm     G. Age:  18w 4d       64  %
 NFT:     2.29  mm

 Est. FW:     252  gm      0 lb 9 oz     54  %
Gestational Age

 Clinical EDD:  18w 2d                                        EDD:   11/05/12
 U/S Today:     18w 3d                                        EDD:   11/04/12
 Best:          18w 2d     Det. By:  Clinical EDD             EDD:   11/05/12
Anatomy

 Cranium:          Appears normal         Ductal Arch:      Appears normal
 Fetal Cavum:      Appears normal         Diaphragm:        Appears normal
 Ventricles:       Appears normal         Stomach:          Appears normal, left
                                                            sided
 Choroid Plexus:   Appears normal         Abdomen:          Appears normal
 Cerebellum:       Appears normal         Abdominal Wall:   Appears nml (cord
                                                            insert, abd wall)
 Posterior Fossa:  Appears normal         Cord Vessels:     Appears normal (3
                                                            vessel cord)
 Nuchal Fold:      Appears normal         Kidneys:          Appear normal
 Lips:             Appears normal         Bladder:          Appears normal
 Heart:            Appears normal         Spine:            Appears normal
                   (4CH, axis, and
                   situs)
 RVOT:             Appears normal         Lower             Appears normal
                                          Extremities:
 LVOT:             Appears normal         Upper             Appears normal
                                          Extremities:
 Aortic Arch:      Appears normal

 Other:  Female gender. Heels and 5th digit visualized.
Cervix Uterus Adnexa

 Cervical Length:    3        cm

 Cervix:       Normal appearance by transvaginal scan
 Uterus:       No abnormality visualized.

 Left Ovary:    Within normal limits.
 Right Ovary:   Within normal limits.
 Adnexa:     No abnormality visualized.
Impression

 Single living intrauterine pregnancy in breech presentation.
 The estimated gestational age is 18w 2d based on Clinical
 EDD . Size and dates correlate well. No fetal anomalies are
 identified.

 questions or concerns.
                 Attending Physician, OLAYIKA

## 2014-07-09 ENCOUNTER — Encounter (HOSPITAL_COMMUNITY): Payer: Self-pay | Admitting: *Deleted

## 2015-06-30 ENCOUNTER — Emergency Department (HOSPITAL_COMMUNITY)
Admission: EM | Admit: 2015-06-30 | Discharge: 2015-06-30 | Disposition: A | Discharge status: Home / Self Care | Payer: Self-pay | Attending: Emergency Medicine | Admitting: Emergency Medicine

## 2015-06-30 ENCOUNTER — Emergency Department (HOSPITAL_COMMUNITY): Payer: Medicaid Other

## 2015-06-30 ENCOUNTER — Encounter (HOSPITAL_COMMUNITY): Payer: Self-pay | Admitting: Oncology

## 2015-06-30 ENCOUNTER — Emergency Department (HOSPITAL_COMMUNITY): Payer: Self-pay

## 2015-06-30 DIAGNOSIS — N898 Other specified noninflammatory disorders of vagina: Secondary | ICD-10-CM | POA: Insufficient documentation

## 2015-06-30 DIAGNOSIS — R1030 Lower abdominal pain, unspecified: Secondary | ICD-10-CM | POA: Insufficient documentation

## 2015-06-30 DIAGNOSIS — R102 Pelvic and perineal pain: Secondary | ICD-10-CM

## 2015-06-30 DIAGNOSIS — R300 Dysuria: Secondary | ICD-10-CM | POA: Insufficient documentation

## 2015-06-30 DIAGNOSIS — R1110 Vomiting, unspecified: Secondary | ICD-10-CM | POA: Insufficient documentation

## 2015-06-30 DIAGNOSIS — Z3202 Encounter for pregnancy test, result negative: Secondary | ICD-10-CM | POA: Insufficient documentation

## 2015-06-30 LAB — COMPREHENSIVE METABOLIC PANEL
ALBUMIN: 4 g/dL (ref 3.5–5.0)
ALT: 63 U/L — AB (ref 14–54)
AST: 94 U/L — AB (ref 15–41)
Alkaline Phosphatase: 69 U/L (ref 38–126)
Anion gap: 6 (ref 5–15)
BILIRUBIN TOTAL: 0.3 mg/dL (ref 0.3–1.2)
BUN: 16 mg/dL (ref 6–20)
CHLORIDE: 108 mmol/L (ref 101–111)
CO2: 23 mmol/L (ref 22–32)
CREATININE: 0.57 mg/dL (ref 0.44–1.00)
Calcium: 9 mg/dL (ref 8.9–10.3)
GFR calc Af Amer: >60 mL/min (ref >60)
GLUCOSE: 110 mg/dL — AB (ref 65–99)
POTASSIUM: 4.1 mmol/L (ref 3.5–5.1)
Sodium: 137 mmol/L (ref 135–145)
Total Protein: 7.5 g/dL (ref 6.5–8.1)

## 2015-06-30 LAB — CBC
HEMATOCRIT: 32 % — AB (ref 36.0–46.0)
Hemoglobin: 10.4 g/dL — ABNORMAL LOW (ref 12.0–15.0)
MCH: 28.6 pg (ref 26.0–34.0)
MCHC: 32.5 g/dL (ref 30.0–36.0)
MCV: 87.9 fL (ref 78.0–100.0)
PLATELETS: 265 10*3/uL (ref 150–400)
RBC: 3.64 MIL/uL — ABNORMAL LOW (ref 3.87–5.11)
RDW: 16 % — AB (ref 11.5–15.5)
WBC: 10 10*3/uL (ref 4.0–10.5)

## 2015-06-30 LAB — LIPASE, BLOOD: LIPASE: 28 U/L (ref 11–51)

## 2015-06-30 LAB — WET PREP, GENITAL
CLUE CELLS WET PREP: NONE SEEN
TRICH WET PREP: NONE SEEN
YEAST WET PREP: NONE SEEN

## 2015-06-30 LAB — URINE MICROSCOPIC-ADD ON

## 2015-06-30 LAB — URINALYSIS, ROUTINE W REFLEX MICROSCOPIC
Bilirubin Urine: NEGATIVE
GLUCOSE, UA: NEGATIVE mg/dL
KETONES UR: NEGATIVE mg/dL
Leukocytes, UA: NEGATIVE
Nitrite: NEGATIVE
PH: 8 (ref 5.0–8.0)
Protein, ur: NEGATIVE mg/dL
SPECIFIC GRAVITY, URINE: 1.021 (ref 1.005–1.030)
Urobilinogen, UA: 1 mg/dL (ref 0.0–1.0)

## 2015-06-30 LAB — PREGNANCY, URINE: Preg Test, Ur: NEGATIVE

## 2015-06-30 LAB — I-STAT CG4 LACTIC ACID, ED: Lactic Acid, Venous: 1.08 mmol/L (ref 0.5–2.0)

## 2015-06-30 MED ORDER — CEFTRIAXONE SODIUM 250 MG IJ SOLR
250.0000 mg | INTRAMUSCULAR | Status: DC
  Administered 2015-06-30: 250 mg via INTRAMUSCULAR
  Filled 2015-06-30: qty 250

## 2015-06-30 MED ORDER — OXYCODONE-ACETAMINOPHEN 5-325 MG PO TABS: 2.0000 | ORAL_TABLET | ORAL | Status: DC | PRN

## 2015-06-30 MED ORDER — LIDOCAINE HCL (PF) 1 % IJ SOLN
INTRAMUSCULAR | Status: AC
  Administered 2015-06-30: 0.9 mL
  Filled 2015-06-30: qty 5

## 2015-06-30 MED ORDER — ONDANSETRON HCL 4 MG/2ML IJ SOLN
4.0000 mg | Freq: Once | INTRAMUSCULAR | Status: AC
  Administered 2015-06-30: 4 mg via INTRAVENOUS
  Filled 2015-06-30: qty 2

## 2015-06-30 MED ORDER — HYDROMORPHONE HCL 1 MG/ML IJ SOLN
1.0000 mg | Freq: Once | INTRAMUSCULAR | Status: AC
  Administered 2015-06-30: 1 mg via INTRAVENOUS
  Filled 2015-06-30: qty 1

## 2015-06-30 MED ORDER — IOHEXOL 300 MG/ML  SOLN
50.0000 mL | Freq: Once | INTRAMUSCULAR | Status: AC | PRN
  Administered 2015-06-30: 50 mL via ORAL

## 2015-06-30 MED ORDER — ONDANSETRON 4 MG PO TBDP: 4.0000 mg | ORAL_TABLET | Freq: Three times a day (TID) | ORAL | Status: DC | PRN

## 2015-06-30 MED ORDER — SODIUM CHLORIDE 0.9 % IV BOLUS (SEPSIS)
1000.0000 mL | Freq: Once | INTRAVENOUS | Status: AC
  Administered 2015-06-30: 1000 mL via INTRAVENOUS

## 2015-06-30 MED ORDER — DOXYCYCLINE HYCLATE 100 MG PO CAPS: 100.0000 mg | ORAL_CAPSULE | Freq: Two times a day (BID) | ORAL | Status: DC

## 2015-06-30 MED ORDER — IOHEXOL 300 MG/ML  SOLN
100.0000 mL | Freq: Once | INTRAMUSCULAR | Status: AC | PRN
  Administered 2015-06-30: 100 mL via INTRAVENOUS

## 2015-06-30 MED ORDER — MORPHINE SULFATE (PF) 4 MG/ML IV SOLN
4.0000 mg | Freq: Once | INTRAVENOUS | Status: AC
  Administered 2015-06-30: 4 mg via INTRAVENOUS
  Filled 2015-06-30: qty 1

## 2015-06-30 NOTE — ED Notes (Signed)
Pt presents via PTAR w/ abdominal pain and 1 episode of emesis.

## 2015-06-30 NOTE — ED Provider Notes (Signed)
Hand-off from TRW AutomotiveKelly Humes, New JerseyPA-C.   Pt is a 24 yo female who presents with lower abdominal pain, onset last night. Endorses dysuria, vomiting x1, vaginal d/c. G1P1.   Exam revealed suprapubic, RLQ and LLQ TTP, no peritoneal signs. Pelvic exam revealed tender uterus, friable cervix, adnexum TTP (L>R), white thick vaginal d/c, no CMT. Negative pregnancy. Labs unremarkable. GC/CH sent.  US Pelvic, Transvaginal, Art/Ven Flow Abd Pelvic unremarkable with no evidence of torsion. CT abdomen showed trace ascites within abdomen and pelvis with mild associated edema of underlying fat at pelvis, may be due to PID and diffuse prominence of periuterine vasculature, question for pelvic congestion syndrome.   Pt reports her abdominal pain and nausea have improved. Discussed findings with pt and plan for d/c home. Pt given IM rocephin in ED and d/c home with rx for doxycycline and zofran to tx suspected PID. Pt given follow up for women's clinic.   Evaluation does not show pathology requring ongoing emergent intervention or admission. Pt is hemodynamically stable and mentating appropriately. Discussed findings/results and plan with patient/guardian, who agrees with plan. All questions answered. Return precautions discussed and outpatient follow up given.    [name] [name], [place]-C 06/30/15 [id number]  Shon Batonourtney F Horton, MD 06/30/15 [phone]

## 2015-06-30 NOTE — Discharge Instructions (Signed)
Take your medications as prescribed as needed. Call the health and wellness clinic to schedule a follow up appointment in the next 3-4 days. Please return to the Emergency Department if symptoms worsen or new onset of fever, vomiting, vaginal bleeding, blood in emesis.    Emergency Department Resource Guide 1) Find a Doctor and Pay Out of Pocket Although you won't have to find out who is covered by your insurance plan, it is a good idea to ask around and get recommendations. You will then need to call the office and see if the doctor you have chosen will accept you as a new patient and what types of options they offer for patients who are self-pay. Some doctors offer discounts or will set up payment plans for their patients who do not have insurance, but you will need to ask so you aren't surprised when you get to your appointment.  2) Contact Your Local Health Department Not all health departments have doctors that can see patients for sick visits, but many do, so it is worth a call to see if yours does. If you don't know where your local health department is, you can check in your phone book. The CDC also has a tool to help you locate your state's health department, and many state websites also have listings of all of their local health departments.  3) Find a Walk-in Clinic If your illness is not likely to be very severe or complicated, you may want to try a walk in clinic. These are popping up all over the country in pharmacies, drugstores, and shopping centers. They're usually staffed by nurse practitioners or physician assistants that have been trained to treat common illnesses and complaints. They're usually fairly quick and inexpensive. However, if you have serious medical issues or chronic medical problems, these are probably not your best option.  No Primary Care Doctor: - Call Health Connect at  [phone] - they can help you locate a primary care doctor that  accepts your insurance,  provides certain services, etc. - Physician Referral Service- [phone]  Chronic Pain Problems: Organization         Address  Phone   Notes  [facility]  [phone] Patients need to be referred by their primary care doctor.   Medication Assistance: Organization         Address  Phone   Notes  [facility] [address] [place], [place] [address] [phone]) [phone] --Must be a resident of [facility] -- Must have NO insurance coverage whatsoever (no Medicaid/ Medicare, etc.) -- The pt. MUST have a primary care doctor that directs their care regularly and follows them in the community   MedAssist  [phone]) [phone]   [employer]  [phone]    Agencies that provide inexpensive medical care: Organization         Address  Phone   Notes  [facility]  [phone]) [phone]   [facility]    [phone]   [facility] [address] [place], [place] [address] [phone]) [phone]   [facility] [place] [address], [place] [phone]   Planned Parenthood    [phone]) [phone]   [facility]    [phone]   [facility]  [address], [place] Phone:  [phone], Fax:  [phone]) [phone] Hours of Operation:  9 am - 6 pm, M-F.  Also accepts Medicaid/Medicare and self-pay.  [facility] for [place], [address], [place] Phone: [phone], Fax: [phone]. Hours of Operation:  8:30 am - 5:30 pm, M-F.  Also accepts Medicaid and self-pay.  [facility] [place] [address] [place] Phone: [phone]   [place], [place], [place] [phone], Ext. 123 Mondays & Thursdays: 7-9 AM.  First 15 patients are seen on a first come, first serve basis.    [place] Providers:  Organization         Address  Phone    Notes  [facility] [address], [place] [phone] Also accepts self-pay patients.  [facility] [address], [place]  [phone]   [place], [address], [place] [phone]   [facility] [address], [place] [phone]   Lucianne Lei [address], [place]   [phone] Only accepts [place] Access [place] patients after they have their name applied to their card.   Self-Pay (no insurance) in [facility]:  Organization         Address  Phone   Notes  Sickle Cell Patients, [facility] [place] [phone]   [facility] Urgent Care [place] [phone]   [facility] [place]  [place], [place], [place] [phone]   [facility]/Dr. Osei-Bonsu  [address] [place] or [address], [place] [phone] Phone number for both [place] and [place] locations is the same.  [facility] [address] [place] [phone]   [facility] [address], [place] or [address] [phone]   [facility] [address] [place] [phone], phone; [phone], fax Sees patients 1st and 3rd Saturday of every month.  Must not qualify for public or private insurance (i.e. Medicaid, Medicare, [place] Health Choice, Veterans' Benefits)  Household income should be no more than 200% of the poverty level The clinic cannot treat you if you are pregnant or think you are pregnant  Sexually transmitted diseases are not treated at the clinic.    Dental Care: Organization         Address  Phone  Notes  [facility] [place] [phone] Accepts children up to age 1 who are enrolled in [place] or [place]; pregnant women with a Medicaid card; and children who have applied for Medicaid or [place] Health Choice, but were declined, whose parents can pay a reduced fee at time of service.  [facility] Department of [facility]  [address], [place] [phone] Accepts children up to age 16 who are enrolled in [place] or [place]; pregnant women with a Medicaid card; and children who have applied for Medicaid or [place] Health Choice, but were declined, whose parents can pay a reduced fee at time of service.  [facility]  [place] [phone] Patients are seen by appointment only. Walk-ins are not accepted. [place] will see patients 31 years of age and older. Monday - Tuesday (  8am-5pm) Most Wednesdays (8:30-5pm) $30 per visit, cash only  [facility]  [address], [facility] [phone] Patients are seen by appointment only. Walk-ins are not accepted. [place] will see patients 67 years of age and older. One Wednesday Evening (Monthly: Volunteer Based).  $30 per visit, cash only  [place]  [phone] for adults; Children under age 73, call Graduate Pediatric Dentistry at [phone]. Children aged 83-14, please call [phone] to request a pediatric application.  Dental services are provided in all areas of dental care including fillings, crowns and bridges, complete and partial dentures, implants, gum treatment, root canals, and extractions. Preventive care is also provided. Treatment is provided to both adults and children. Patients are selected via a lottery and there is often a waiting list.   [facility] [address], [place]  [phone] [email or web address]   [facility] [address] [place], [place] [phone], Ext.  123 Second and Fourth Thursday of each month, opens at 6:30 AM; Clinic ends at 9 AM.  Patients are seen on a first-come first-served basis, and a limited number are seen during each clinic.   [facility]  [address] Hillard Danker [place], [place] [phone]   Eligibility Requirements You must have lived in [place], [place], or [place] counties for at least the last three months.   You cannot be eligible for state or federal sponsored [employer], including [employer], [place], or [employer].   You generally cannot be eligible for healthcare insurance through your employer.    How to apply: Eligibility screenings are held every Tuesday and Wednesday afternoon from 1:00 pm until 4:00 pm. You do not need an appointment for the interview!  [facility] [address], [place], [place]   [place]  [place] Department  [place]  [phone]    Behavioral Health Resources in the Community: Intensive Outpatient Programs Organization         Address  Phone  Notes  [place]. [address], [place], [place] [phone]   [facility] Outpatient [address] [place], [place]   ADS: [facility] [address] [place], [place]   [place] [address]  [place], [place] or [phone]   Substance Abuse Resources Organization         Address  Phone  Notes  Alcohol and Drug Services  [phone]   [place]  [phone]   [facility] [place]   Chinita Pester  [phone]   Residential & Outpatient Substance Abuse Program  [phone]   Psychological Services Organization         Address  Phone  Notes  [facility] [place]  [place]  [phone]   [place] [address] [place] or [phone]    Mobile Crisis Teams Organization         Address  Phone  Notes  [facility], Mobile Crisis Care Unit  [phone]   [facility]  [address] [place], [place]   [place] [address] [place] [phone]    Self-Help/Support Groups Organization         Address  Phone  Notes  [facility] [place] - variety of support groups  [phone]- [phone] Call for more information  Narcotics Anonymous (NA), [facility] [address], [employer] [place]  2 meetings at this location   Statistician         Address  Phone  Notes  [facility] [address],    [place]  [phone]   [facility]  [address] [place] [address], [place], [place] [phone]   [facility] [address] [name], [place] [phone] Admissions: 8am-3pm M-F  [facility] [address],    [place], [place] [phone]   [facility] [address] [place], [place], [place] [phone]   [facility] [address],  Presidential [place], [place] [phone]   [facility] - Intensive Outpatient [address], [place], [place] [phone]   [facility] ([facility] [address] [place]  [place], [place] [phone] or [phone]   [facility] Services (RTS) [address] [place], [place] [phone] Accepts Medicaid  [facility] [place] [address]  [place] [phone] Substance Abuse/Addiction Treatment   [facility] Organization         Address  Phone  Notes  [facility]  [phone]   Angie Fava, PhD [address] Ervin Knack [place], [place]   [phone] or [phone]   [facility] Behavioral   [address] [place], [place] [phone]   [facility] [address] [place], [place] [phone] Insurance/Medicaid/sponsorship through [facility] and Families [address]                                    [place], [place] [phone] Therapy/tele-psych/case  [facility] [address], [place] [phone]    Dr. Lolly Mustache  [phone]   [facility] [place]  [employer] [facility] Dept. 1) [address], [place] 2) [address] [place] 3)  [address], [place] [phone]  [phone]   [facility] Child Abuse Hotline [phone] or [phone] (After Hours)

## 2015-06-30 NOTE — ED Provider Notes (Signed)
CSN: [id number]     Arrival date & time 06/30/15  0131 History   First MD Initiated Contact with Patient 06/30/15 [phone]     Chief Complaint  Patient presents with  . Abdominal Pain     (Consider location/radiation/quality/duration/timing/severity/associated sxs/prior Treatment) HPI Comments: 24 year old G1P1 female with no sick and past medical history presents to the emergency department for further evaluation of lower abdominal pain. Patient states that pain began at 2200 this evening. It is worse on the left than the right. She reports pain being preceded by dysuria which began yesterday. Onset of pain associated with one episode of emesis. She also reports vaginal discharge x 3 days which ceased 3 days ago. Patient denies any fever, chest pain, hematuria, or diarrhea. No history of abdominal surgeries. No medications taken prior to arrival for symptoms.  Patient is a 24 y.o. female presenting with abdominal pain. The history is provided by the patient. No language interpreter was used.  Abdominal Pain Associated symptoms: dysuria   Associated symptoms: no fever, no nausea and no vomiting     Past Medical History  Diagnosis Date  . Medical history non-contributory    Past Surgical History  Procedure Laterality Date  . Dilation and curettage of uterus     Family History  Problem Relation Age of Onset  . Heart disease Father    Social History  Substance Use Topics  . Smoking status: Never Smoker   . Smokeless tobacco: Never Used  . Alcohol Use: No   OB History    Gravida Para Term Preterm AB TAB SAB Ectopic Multiple Living   2 1 1  0 1  1   1       Review of Systems  Constitutional: Negative for fever.  Gastrointestinal: Positive for abdominal pain. Negative for nausea and vomiting.  Genitourinary: Positive for dysuria.  All other systems reviewed and are negative.   Allergies  Review of patient's allergies indicates no known allergies.  Home Medications   Prior  to Admission medications   Not on File   BP 107/56 mmHg  Pulse 62  Temp(Src) 97.5 F (36.4 C) (Oral)  Resp 19  SpO2 100%  LMP 06/11/2015   Physical Exam  Constitutional: She is oriented to person, place, and time. She appears well-developed and well-nourished. No distress.  Nontoxic/nonseptic appearing  HENT:  Head: Normocephalic and atraumatic.  Eyes: Conjunctivae and EOM are normal. No scleral icterus.  Neck: Normal range of motion.  Cardiovascular: Normal rate and regular rhythm.   Pulmonary/Chest: Effort normal and breath sounds normal. No respiratory distress. She has no wheezes. She has no rales.  Lungs clear bilaterally. Respirations even and unlabored  Abdominal: Soft. She exhibits no distension. There is tenderness. There is no rebound.  Suprapubic, RLQ, and LLQ abdominal exquisite TTP. Abdomen soft. No masses or rigidity. No peritoneal signs.  Genitourinary: There is no rash, tenderness or lesion on the right labia. There is no rash, tenderness or lesion on the left labia. Uterus is tender. Cervix exhibits friability. Cervix exhibits no motion tenderness. Right adnexum displays tenderness (+). Right adnexum displays no mass. Left adnexum displays tenderness (+++). No bleeding in the vagina. Vaginal discharge (white, thick in vaginal vault) found.  No CMT. There is adnexal TTP b/l (L>R). Tenderness is out of proportion to exam findings.  Musculoskeletal: Normal range of motion.  Neurological: She is alert and oriented to person, place, and time. She exhibits normal muscle tone. Coordination normal.  Skin: Skin is warm  and dry. No rash noted. She is not diaphoretic. No erythema. No pallor.  Psychiatric: She has a normal mood and affect. Her behavior is normal.  Nursing note and vitals reviewed.   ED Course  Procedures (including critical care time) Labs Review Labs Reviewed  WET PREP, GENITAL - Abnormal; Notable for the following:    WBC, Wet Prep HPF POC RARE (*)    All  other components within normal limits  COMPREHENSIVE METABOLIC PANEL - Abnormal; Notable for the following:    Glucose, Bld 110 (*)    AST 94 (*)    ALT 63 (*)    All other components within normal limits  CBC - Abnormal; Notable for the following:    RBC 3.64 (*)    Hemoglobin 10.4 (*)    HCT 32.0 (*)    RDW 16.0 (*)    All other components within normal limits  URINALYSIS, ROUTINE W REFLEX MICROSCOPIC (NOT AT [facility]) - Abnormal; Notable for the following:    APPearance TURBID (*)    Hgb urine dipstick TRACE (*)    All other components within normal limits  URINE MICROSCOPIC-ADD ON - Abnormal; Notable for the following:    Squamous Epithelial / LPF MANY (*)    Bacteria, UA MANY (*)    All other components within normal limits  LIPASE, BLOOD  PREGNANCY, URINE  I-STAT CG4 LACTIC ACID, ED  GC/CHLAMYDIA PROBE AMP ([place]) NOT AT [facility]    Imaging Review US Transvaginal Non-ob  06/30/2015  CLINICAL DATA:  Acute onset of pelvic pain.  Initial encounter. EXAM: TRANSABDOMINAL AND TRANSVAGINAL ULTRASOUND OF PELVIS DOPPLER ULTRASOUND OF OVARIES TECHNIQUE: Both transabdominal and transvaginal ultrasound examinations of the pelvis were performed. Transabdominal technique was performed for global imaging of the pelvis including uterus, ovaries, adnexal regions, and pelvic cul-de-sac. It was necessary to proceed with endovaginal exam following the transabdominal exam to visualize the uterus and ovaries in greater detail. Color and duplex Doppler ultrasound was utilized to evaluate blood flow to the ovaries. COMPARISON:  Pelvic ultrasound performed 09/30/2012 FINDINGS: Uterus Measurements: 8.0 x 3.5 x 4.4 cm. No fibroids or other mass visualized. Endometrium Thickness: 0.7 cm.  No focal abnormality visualized. Right ovary Measurements: 2.8 x 1.4 x 1.8 cm. Normal appearance/no adnexal mass. Left ovary Measurements: 3.9 x 2.9 x 2.8 cm. Normal appearance/no adnexal mass. Pulsed Doppler evaluation of  both ovaries demonstrates normal low-resistance arterial and venous waveforms. Other findings A small amount of free fluid is seen within the pelvic cul-de-sac. IMPRESSION: Unremarkable pelvic ultrasound.  No evidence for ovarian torsion. Electronically Signed   By: Roanna Raider M.D.   On: 06/30/2015 04:36   US Pelvis Complete  06/30/2015  CLINICAL DATA:  Acute onset of pelvic pain.  Initial encounter. EXAM: TRANSABDOMINAL AND TRANSVAGINAL ULTRASOUND OF PELVIS DOPPLER ULTRASOUND OF OVARIES TECHNIQUE: Both transabdominal and transvaginal ultrasound examinations of the pelvis were performed. Transabdominal technique was performed for global imaging of the pelvis including uterus, ovaries, adnexal regions, and pelvic cul-de-sac. It was necessary to proceed with endovaginal exam following the transabdominal exam to visualize the uterus and ovaries in greater detail. Color and duplex Doppler ultrasound was utilized to evaluate blood flow to the ovaries. COMPARISON:  Pelvic ultrasound performed 09/30/2012 FINDINGS: Uterus Measurements: 8.0 x 3.5 x 4.4 cm. No fibroids or other mass visualized. Endometrium Thickness: 0.7 cm.  No focal abnormality visualized. Right ovary Measurements: 2.8 x 1.4 x 1.8 cm. Normal appearance/no adnexal mass. Left ovary Measurements: 3.9 x 2.9 x  2.8 cm. Normal appearance/no adnexal mass. Pulsed Doppler evaluation of both ovaries demonstrates normal low-resistance arterial and venous waveforms. Other findings A small amount of free fluid is seen within the pelvic cul-de-sac. IMPRESSION: Unremarkable pelvic ultrasound.  No evidence for ovarian torsion. Electronically Signed   By: Roanna Raider M.D.   On: 06/30/2015 04:36   [place] Art/ven Flow Abd Pelv Doppler  06/30/2015  CLINICAL DATA:  Acute onset of pelvic pain.  Initial encounter. EXAM: TRANSABDOMINAL AND TRANSVAGINAL ULTRASOUND OF PELVIS DOPPLER ULTRASOUND OF OVARIES TECHNIQUE: Both transabdominal and transvaginal ultrasound  examinations of the pelvis were performed. Transabdominal technique was performed for global imaging of the pelvis including uterus, ovaries, adnexal regions, and pelvic cul-de-sac. It was necessary to proceed with endovaginal exam following the transabdominal exam to visualize the uterus and ovaries in greater detail. Color and duplex Doppler ultrasound was utilized to evaluate blood flow to the ovaries. COMPARISON:  Pelvic ultrasound performed 09/30/2012 FINDINGS: Uterus Measurements: 8.0 x 3.5 x 4.4 cm. No fibroids or other mass visualized. Endometrium Thickness: 0.7 cm.  No focal abnormality visualized. Right ovary Measurements: 2.8 x 1.4 x 1.8 cm. Normal appearance/no adnexal mass. Left ovary Measurements: 3.9 x 2.9 x 2.8 cm. Normal appearance/no adnexal mass. Pulsed Doppler evaluation of both ovaries demonstrates normal low-resistance arterial and venous waveforms. Other findings A small amount of free fluid is seen within the pelvic cul-de-sac. IMPRESSION: Unremarkable pelvic ultrasound.  No evidence for ovarian torsion. Electronically Signed   By: Roanna Raider M.D.   On: 06/30/2015 04:36     I have personally reviewed and evaluated these images and lab results as part of my medical decision-making.   EKG Interpretation None       Medications  sodium chloride 0.9 % bolus 1,000 mL (0 mLs Intravenous Stopped 06/30/15 0334)  ondansetron (ZOFRAN) injection 4 mg (4 mg Intravenous Given 06/30/15 0208)  morphine 4 MG/ML injection 4 mg (4 mg Intravenous Given 06/30/15 0238)  HYDROmorphone (DILAUDID) injection 1 mg (1 mg Intravenous Given 06/30/15 0334)  iohexol (OMNIPAQUE) 300 MG/ML solution 50 mL (50 mLs Oral Contrast Given 06/30/15 0513)    MDM   Final diagnoses:  Lower abdominal pain    24 year old female presents to the emergency department for evaluation of lower abdominal pain which was sudden in onset this evening at 2200. Symptoms associated with emesis x 1. Patient notable for  tenderness in her bilateral lower quadrants and suprapubic abdomen. She has some voluntary guarding on exam without masses or peritoneal signs. Laboratory workup and imaging thus far has been noncontributory. Pain, however, persists. Given degree of discomfort that the patient is in, will obtain CT scan for further evaluation. Patient signed out to oncoming ED midlevel provider at change of shift. Anticipate discharge if CT imaging is negative for acute findings.   Filed Vitals:   06/30/15 0131 06/30/15 0133 06/30/15 0237  BP:  108/65 107/56  Pulse:  57 62  Temp:  97.5 F (36.4 C)   TempSrc:  Oral   Resp:  20 19  SpO2: 96% 100% 100%       Antony Madura, PA-C 06/30/15 1610  Shon Baton, MD 06/30/15 [phone]

## 2015-07-01 LAB — GC/CHLAMYDIA PROBE AMP (~~LOC~~) NOT AT ARMC
Chlamydia: NEGATIVE
Neisseria Gonorrhea: NEGATIVE

## 2015-07-11 ENCOUNTER — Encounter: Payer: Self-pay | Admitting: Family Medicine

## 2015-07-11 ENCOUNTER — Ambulatory Visit (INDEPENDENT_AMBULATORY_CARE_PROVIDER_SITE_OTHER): Payer: Medicaid Other | Admitting: Family Medicine

## 2015-07-11 VITALS — BP 98/51 | HR 59 | Temp 97.8°F | Ht <= 58 in | Wt 96.1 lb

## 2015-07-11 DIAGNOSIS — R102 Pelvic and perineal pain: Secondary | ICD-10-CM

## 2015-07-11 NOTE — Progress Notes (Signed)
   Subjective:    Patient ID: [name] [name], female    DOB: 03-13-1991, 24 y.o.   MRN: [id number]  HPI This patient was referred to our clinic for suspected PID.  The patient was seen in the ED on 10/23 and was given Ceftriaxone, azithromycin, and 1 week of doxycycline.  Her cultures were negative.  The pain just prior to being seen.  The pain has been improving and now she feels it every once in awhile.  She has a couple of tablets left due to missing a couple of tablets.  She denies vaginal discharge.    Review of Systems  Constitutional: Negative for fever, chills and fatigue.  Genitourinary: Negative for dysuria, urgency, vaginal bleeding, vaginal discharge, vaginal pain and pelvic pain.  All other systems reviewed and are negative.  I have reviewed the patients past medical, family, and social history.  I have reviewed the patient's medication list and allergies.     Objective:   Physical Exam  Constitutional: She is oriented to person, place, and time. She appears well-developed and well-nourished.  HENT:  Head: Normocephalic and atraumatic.  Right Ear: External ear normal.  Left Ear: External ear normal.  Pulmonary/Chest: Effort normal.  Abdominal: Soft. She exhibits no distension and no mass. There is no tenderness. There is no rebound and no guarding.  Genitourinary: There is no rash, tenderness or lesion on the right labia. There is no rash, tenderness or lesion on the left labia. Cervix exhibits no motion tenderness, no discharge and no friability. No erythema, tenderness or bleeding in the vagina. No foreign body around the vagina. No signs of injury around the vagina. No vaginal discharge found.  Neurological: She is alert and oriented to person, place, and time.  Skin: Skin is warm and dry. No rash noted. No erythema. No pallor.  Psychiatric: She has a normal mood and affect. Her behavior is normal. Judgment and thought content normal.      Assessment & Plan:  1. Pelvic  pain in female Recommended completing course.  Continue ibuprofen/NSAIDs. Return if pain worsens.

## 2015-07-11 NOTE — Progress Notes (Signed)
Spanish interpreter Delorise RoyalsJulie Sowell;  Pt given # to Free Pap Screening

## 2015-09-08 NOTE — L&D Delivery Note (Signed)
  Delivery Note At 1:16 AM a viable female was delivered via Vaginal, Spontaneous Delivery (Presentation:ROA  ).  APGAR: 9, 9; weight  pending.   After 2 minutes, the cord was clamped and cut. 40 units of pitocin diluted in 1000cc LR was infused rapidly IV.  The placenta separated spontaneously and delivered via CCT and maternal pushing effort.  It was inspected and appears to be intact with a 3 VC. Anesthesia:  none Episiotomy: None Lacerations: None Suture Repair:  Est. Blood Loss (mL): 150  Mom to postpartum.  Baby to Couplet care / Skin to Skin.  CRESENZO-DISHMAN,Tanairi Cypert 06/19/2016, 1:28 AM

## 2015-12-12 ENCOUNTER — Encounter (HOSPITAL_COMMUNITY): Payer: Self-pay | Admitting: Nurse Practitioner

## 2015-12-12 LAB — OB RESULTS CONSOLE HIV ANTIBODY (ROUTINE TESTING): HIV: NONREACTIVE

## 2015-12-12 LAB — OB RESULTS CONSOLE ABO/RH: RH Type: POSITIVE

## 2015-12-12 LAB — OB RESULTS CONSOLE RPR: RPR: NONREACTIVE

## 2015-12-12 LAB — OB RESULTS CONSOLE GC/CHLAMYDIA
Chlamydia: NEGATIVE
GC PROBE AMP, GENITAL: NEGATIVE

## 2015-12-12 LAB — OB RESULTS CONSOLE RUBELLA ANTIBODY, IGM: RUBELLA: IMMUNE

## 2015-12-12 LAB — OB RESULTS CONSOLE ANTIBODY SCREEN: Antibody Screen: NEGATIVE

## 2015-12-12 LAB — OB RESULTS CONSOLE HEPATITIS B SURFACE ANTIGEN: HEP B S AG: NEGATIVE

## 2015-12-23 ENCOUNTER — Other Ambulatory Visit (HOSPITAL_COMMUNITY): Payer: Self-pay | Admitting: Nurse Practitioner

## 2015-12-23 DIAGNOSIS — Z3682 Encounter for antenatal screening for nuchal translucency: Secondary | ICD-10-CM

## 2015-12-27 ENCOUNTER — Other Ambulatory Visit (HOSPITAL_COMMUNITY): Payer: Self-pay | Admitting: Nurse Practitioner

## 2015-12-27 ENCOUNTER — Encounter (HOSPITAL_COMMUNITY): Payer: Self-pay

## 2015-12-27 ENCOUNTER — Ambulatory Visit (HOSPITAL_COMMUNITY)
Admission: RE | Admit: 2015-12-27 | Discharge: 2015-12-27 | Disposition: A | Discharge status: Home / Self Care | Payer: Self-pay | Source: Ambulatory Visit | Attending: Nurse Practitioner | Admitting: Nurse Practitioner

## 2015-12-27 ENCOUNTER — Ambulatory Visit (HOSPITAL_COMMUNITY): Admission: RE | Admit: 2015-12-27 | Payer: Self-pay | Source: Ambulatory Visit

## 2015-12-27 DIAGNOSIS — Z3492 Encounter for supervision of normal pregnancy, unspecified, second trimester: Secondary | ICD-10-CM

## 2015-12-27 DIAGNOSIS — Z363 Encounter for antenatal screening for malformations: Secondary | ICD-10-CM

## 2015-12-27 DIAGNOSIS — Z1389 Encounter for screening for other disorder: Secondary | ICD-10-CM

## 2015-12-27 DIAGNOSIS — Z36 Encounter for antenatal screening of mother: Secondary | ICD-10-CM | POA: Insufficient documentation

## 2015-12-27 DIAGNOSIS — Z3A16 16 weeks gestation of pregnancy: Secondary | ICD-10-CM | POA: Insufficient documentation

## 2015-12-27 DIAGNOSIS — Z3682 Encounter for antenatal screening for nuchal translucency: Secondary | ICD-10-CM

## 2015-12-30 ENCOUNTER — Other Ambulatory Visit (HOSPITAL_COMMUNITY): Payer: Self-pay | Admitting: *Deleted

## 2015-12-30 DIAGNOSIS — Z0489 Encounter for examination and observation for other specified reasons: Secondary | ICD-10-CM

## 2015-12-30 DIAGNOSIS — IMO0002 Reserved for concepts with insufficient information to code with codable children: Secondary | ICD-10-CM

## 2016-01-21 ENCOUNTER — Encounter (HOSPITAL_COMMUNITY): Payer: Self-pay

## 2016-01-21 ENCOUNTER — Encounter: Payer: Self-pay | Admitting: Family Medicine

## 2016-01-24 ENCOUNTER — Ambulatory Visit (HOSPITAL_COMMUNITY): Payer: Self-pay

## 2016-01-29 ENCOUNTER — Encounter (HOSPITAL_COMMUNITY): Payer: Self-pay

## 2016-01-29 ENCOUNTER — Other Ambulatory Visit (HOSPITAL_COMMUNITY): Payer: Self-pay | Admitting: Maternal and Fetal Medicine

## 2016-01-29 ENCOUNTER — Ambulatory Visit (HOSPITAL_COMMUNITY)
Admission: RE | Admit: 2016-01-29 | Discharge: 2016-01-29 | Disposition: A | Discharge status: Home / Self Care | Payer: Self-pay | Source: Ambulatory Visit | Attending: Nurse Practitioner | Admitting: Nurse Practitioner

## 2016-01-29 DIAGNOSIS — IMO0002 Reserved for concepts with insufficient information to code with codable children: Secondary | ICD-10-CM

## 2016-01-29 DIAGNOSIS — Z36 Encounter for antenatal screening of mother: Secondary | ICD-10-CM | POA: Insufficient documentation

## 2016-01-29 DIAGNOSIS — Z0489 Encounter for examination and observation for other specified reasons: Secondary | ICD-10-CM

## 2016-01-29 DIAGNOSIS — Z3A20 20 weeks gestation of pregnancy: Secondary | ICD-10-CM

## 2016-05-14 LAB — OB RESULTS CONSOLE GC/CHLAMYDIA
Chlamydia: NEGATIVE
Gonorrhea: NEGATIVE

## 2016-05-14 LAB — OB RESULTS CONSOLE GBS: STREP GROUP B AG: POSITIVE

## 2016-06-11 ENCOUNTER — Encounter (HOSPITAL_COMMUNITY): Payer: Self-pay

## 2016-06-11 ENCOUNTER — Inpatient Hospital Stay (HOSPITAL_COMMUNITY)
Admission: AD | Admit: 2016-06-11 | Discharge: 2016-06-11 | Disposition: A | Discharge status: Home / Self Care | Payer: Self-pay | Source: Ambulatory Visit | Attending: Obstetrics & Gynecology | Admitting: Obstetrics & Gynecology

## 2016-06-11 DIAGNOSIS — Z3493 Encounter for supervision of normal pregnancy, unspecified, third trimester: Secondary | ICD-10-CM | POA: Insufficient documentation

## 2016-06-11 DIAGNOSIS — Z3A40 40 weeks gestation of pregnancy: Secondary | ICD-10-CM | POA: Insufficient documentation

## 2016-06-11 NOTE — Discharge Instructions (Signed)
Braxton Hicks Contractions °Contractions of the uterus can occur throughout pregnancy. Contractions are not always a sign that you are in labor.  °WHAT ARE BRAXTON HICKS CONTRACTIONS?  °Contractions that occur before labor are called Braxton Hicks contractions, or false labor. Toward the end of pregnancy (32-34 weeks), these contractions can develop more often and may become more forceful. This is not true labor because these contractions do not result in opening (dilatation) and thinning of the cervix. They are sometimes difficult to tell apart from true labor because these contractions can be forceful and people have different pain tolerances. You should not feel embarrassed if you go to the hospital with false labor. Sometimes, the only way to tell if you are in true labor is for your health care provider to look for changes in the cervix. °If there are no prenatal problems or other health problems associated with the pregnancy, it is completely safe to be sent home with false labor and await the onset of true labor. °HOW CAN YOU TELL THE DIFFERENCE BETWEEN TRUE AND FALSE LABOR? °False Labor °· The contractions of false labor are usually shorter and not as hard as those of true labor.   °· The contractions are usually irregular.   °· The contractions are often felt in the front of the lower abdomen and in the groin.   °· The contractions may go away when you walk around or change positions while lying down.   °· The contractions get weaker and are shorter lasting as time goes on.   °· The contractions do not usually become progressively stronger, regular, and closer together as with true labor.   °True Labor °· Contractions in true labor last 30-70 seconds, become very regular, usually become more intense, and increase in frequency.   °· The contractions do not go away with walking.   °· The discomfort is usually felt in the top of the uterus and spreads to the lower abdomen and low back.   °· True labor can be  determined by your health care provider with an exam. This will show that the cervix is dilating and getting thinner.   °WHAT TO REMEMBER °· Keep up with your usual exercises and follow other instructions given by your health care provider.   °· Take medicines as directed by your health care provider.   °· Keep your regular prenatal appointments.   °· Eat and drink lightly if you think you are going into labor.   °· If Braxton Hicks contractions are making you uncomfortable:   °¨ Change your position from lying down or resting to walking, or from walking to resting.   °¨ Sit and rest in a tub of warm water.   °¨ Drink 2-3 glasses of water. Dehydration may cause these contractions.   °¨ Do slow and deep breathing several times an hour.   °WHEN SHOULD I SEEK IMMEDIATE MEDICAL CARE? °Seek immediate medical care if: °· Your contractions become stronger, more regular, and closer together.   °· You have fluid leaking or gushing from your vagina.   °· You have a fever.   °· You pass blood-tinged mucus.   °· You have vaginal bleeding.   °· You have continuous abdominal pain.   °· You have low back pain that you never had before.   °· You feel your baby's head pushing down and causing pelvic pressure.   °· Your baby is not moving as much as it used to.   °  °This information is not intended to replace advice given to you by your health care provider. Make sure you discuss any questions you have with your health care   provider. °  °Document Released: 08/24/2005 Document Revised: 08/29/2013 Document Reviewed: 06/05/2013 °Elsevier Interactive Patient Education ©2016 [employer] °Fetal Movement Counts °Patient Name: __________________________________________________ Patient Due Date: ____________________ °Performing a fetal movement count is highly recommended in high-risk pregnancies, but it is good for every pregnant woman to do. Your health care provider may ask you to start counting fetal movements at 28 weeks of the  pregnancy. Fetal movements often increase: °· After eating a full meal. °· After physical activity. °· After eating or drinking something sweet or cold. °· At rest. °Pay attention to when you feel the baby is most active. This will help you notice a pattern of your baby's sleep and wake cycles and what factors contribute to an increase in fetal movement. It is important to perform a fetal movement count at the same time each day when your baby is normally most active.  °HOW TO COUNT FETAL MOVEMENTS °1. Find a quiet and comfortable area to sit or lie down on your left side. Lying on your left side provides the best blood and oxygen circulation to your baby. °2. Write down the day and time on a sheet of paper or in a journal. °3. Start counting kicks, flutters, swishes, rolls, or jabs in a 2-hour period. You should feel at least 10 movements within 2 hours. °4. If you do not feel 10 movements in 2 hours, wait 2-3 hours and count again. Look for a change in the pattern or not enough counts in 2 hours. °SEEK MEDICAL CARE IF: °· You feel less than 10 counts in 2 hours, tried twice. °· There is no movement in over an hour. °· The pattern is changing or taking longer each day to reach 10 counts in 2 hours. °· You feel the baby is not moving as he or she usually does. °Date: ____________ Movements: ____________ Start time: ____________ Finish time: ____________  °Date: ____________ Movements: ____________ Start time: ____________ Finish time: ____________ °Date: ____________ Movements: ____________ Start time: ____________ Finish time: ____________ °Date: ____________ Movements: ____________ Start time: ____________ Finish time: ____________ °Date: ____________ Movements: ____________ Start time: ____________ Finish time: ____________ °Date: ____________ Movements: ____________ Start time: ____________ Finish time: ____________ °Date: ____________ Movements: ____________ Start time: ____________ Finish time:  ____________ °Date: ____________ Movements: ____________ Start time: ____________ Finish time: ____________  °Date: ____________ Movements: ____________ Start time: ____________ Finish time: ____________ °Date: ____________ Movements: ____________ Start time: ____________ Finish time: ____________ °Date: ____________ Movements: ____________ Start time: ____________ Finish time: ____________ °Date: ____________ Movements: ____________ Start time: ____________ Finish time: ____________ °Date: ____________ Movements: ____________ Start time: ____________ Finish time: ____________ °Date: ____________ Movements: ____________ Start time: ____________ Finish time: ____________ °Date: ____________ Movements: ____________ Start time: ____________ Finish time: ____________  °Date: ____________ Movements: ____________ Start time: ____________ Finish time: ____________ °Date: ____________ Movements: ____________ Start time: ____________ Finish time: ____________ °Date: ____________ Movements: ____________ Start time: ____________ Finish time: ____________ °Date: ____________ Movements: ____________ Start time: ____________ Finish time: ____________ °Date: ____________ Movements: ____________ Start time: ____________ Finish time: ____________ °Date: ____________ Movements: ____________ Start time: ____________ Finish time: ____________ °Date: ____________ Movements: ____________ Start time: ____________ Finish time: ____________  °Date: ____________ Movements: ____________ Start time: ____________ Finish time: ____________ °Date: ____________ Movements: ____________ Start time: ____________ Finish time: ____________ °Date: ____________ Movements: ____________ Start time: ____________ Finish time: ____________ °Date: ____________ Movements: ____________ Start time: ____________ Finish time: ____________ °Date: ____________ Movements: ____________ Start time: ____________ Finish time: ____________ °Date: ____________ Movements:  ____________ Start time: ____________ Finish time:   ____________ °Date: ____________ Movements: ____________ Start time: ____________ Finish time: ____________  °Date: ____________ Movements: ____________ Start time: ____________ Finish time: ____________ °Date: ____________ Movements: ____________ Start time: ____________ Finish time: ____________ °Date: ____________ Movements: ____________ Start time: ____________ Finish time: ____________ °Date: ____________ Movements: ____________ Start time: ____________ Finish time: ____________ °Date: ____________ Movements: ____________ Start time: ____________ Finish time: ____________ °Date: ____________ Movements: ____________ Start time: ____________ Finish time: ____________ °Date: ____________ Movements: ____________ Start time: ____________ Finish time: ____________  °Date: ____________ Movements: ____________ Start time: ____________ Finish time: ____________ °Date: ____________ Movements: ____________ Start time: ____________ Finish time: ____________ °Date: ____________ Movements: ____________ Start time: ____________ Finish time: ____________ °Date: ____________ Movements: ____________ Start time: ____________ Finish time: ____________ °Date: ____________ Movements: ____________ Start time: ____________ Finish time: ____________ °Date: ____________ Movements: ____________ Start time: ____________ Finish time: ____________ °Date: ____________ Movements: ____________ Start time: ____________ Finish time: ____________  °Date: ____________ Movements: ____________ Start time: ____________ Finish time: ____________ °Date: ____________ Movements: ____________ Start time: ____________ Finish time: ____________ °Date: ____________ Movements: ____________ Start time: ____________ Finish time: ____________ °Date: ____________ Movements: ____________ Start time: ____________ Finish time: ____________ °Date: ____________ Movements: ____________ Start time: ____________ Finish  time: ____________ °Date: ____________ Movements: ____________ Start time: ____________ Finish time: ____________ °Date: ____________ Movements: ____________ Start time: ____________ Finish time: ____________  °Date: ____________ Movements: ____________ Start time: ____________ Finish time: ____________ °Date: ____________ Movements: ____________ Start time: ____________ Finish time: ____________ °Date: ____________ Movements: ____________ Start time: ____________ Finish time: ____________ °Date: ____________ Movements: ____________ Start time: ____________ Finish time: ____________ °Date: ____________ Movements: ____________ Start time: ____________ Finish time: ____________ °Date: ____________ Movements: ____________ Start time: ____________ Finish time: ____________ °  °This information is not intended to replace advice given to you by your health care provider. Make sure you discuss any questions you have with your health care provider. °  °Document Released: 09/23/2006 Document Revised: 09/14/2014 Document Reviewed: 06/20/2012 °Elsevier Interactive Patient Education ©2016 Elsevier Inc. ° °

## 2016-06-11 NOTE — MAU Note (Signed)
Patient presents with c/o ctx every 5 mins. Patient denies any bleeding or LOF. Patient is also having some decreased fetal movement since 1400 today. GBS-

## 2016-06-16 ENCOUNTER — Telehealth (HOSPITAL_COMMUNITY): Payer: Self-pay | Admitting: *Deleted

## 2016-06-16 NOTE — Telephone Encounter (Signed)
Preadmission screen  

## 2016-06-19 ENCOUNTER — Inpatient Hospital Stay (HOSPITAL_COMMUNITY)
Admission: RE | Admit: 2016-06-19 | Discharge: 2016-06-19 | Disposition: A | Discharge status: Home / Self Care | Payer: Medicaid Other | Source: Ambulatory Visit | Attending: Obstetrics & Gynecology | Admitting: Obstetrics & Gynecology

## 2016-06-19 ENCOUNTER — Inpatient Hospital Stay (HOSPITAL_COMMUNITY)
Admission: AD | Admit: 2016-06-19 | Discharge: 2016-06-21 | DRG: 775 | Disposition: A | Discharge status: Home / Self Care | Payer: Medicaid Other | Source: Ambulatory Visit | Attending: Family Medicine | Admitting: Family Medicine

## 2016-06-19 ENCOUNTER — Encounter (HOSPITAL_COMMUNITY): Payer: Self-pay | Admitting: *Deleted

## 2016-06-19 DIAGNOSIS — Z3483 Encounter for supervision of other normal pregnancy, third trimester: Secondary | ICD-10-CM | POA: Diagnosis present

## 2016-06-19 DIAGNOSIS — Z8249 Family history of ischemic heart disease and other diseases of the circulatory system: Secondary | ICD-10-CM | POA: Diagnosis not present

## 2016-06-19 DIAGNOSIS — Z3A41 41 weeks gestation of pregnancy: Secondary | ICD-10-CM

## 2016-06-19 DIAGNOSIS — O99824 Streptococcus B carrier state complicating childbirth: Principal | ICD-10-CM | POA: Diagnosis present

## 2016-06-19 LAB — CBC
HEMATOCRIT: 28.8 % — AB (ref 36.0–46.0)
HEMOGLOBIN: 9.7 g/dL — AB (ref 12.0–15.0)
MCH: 29.9 pg (ref 26.0–34.0)
MCHC: 33.7 g/dL (ref 30.0–36.0)
MCV: 88.9 fL (ref 78.0–100.0)
Platelets: 180 10*3/uL (ref 150–400)
RBC: 3.24 MIL/uL — ABNORMAL LOW (ref 3.87–5.11)
RDW: 15 % (ref 11.5–15.5)
WBC: 7.8 10*3/uL (ref 4.0–10.5)

## 2016-06-19 LAB — RPR: RPR Ser Ql: NONREACTIVE

## 2016-06-19 LAB — TYPE AND SCREEN
ABO/RH(D): A POS
ANTIBODY SCREEN: NEGATIVE

## 2016-06-19 MED ORDER — OXYCODONE HCL 5 MG PO TABS
5.0000 mg | ORAL_TABLET | ORAL | Status: DC | PRN
Start: 2016-06-19 — End: 2016-06-21

## 2016-06-19 MED ORDER — LACTATED RINGERS IV SOLN: 500.0000 mL | INTRAVENOUS | Status: DC | PRN

## 2016-06-19 MED ORDER — SOD CITRATE-CITRIC ACID 500-334 MG/5ML PO SOLN: 30.0000 mL | ORAL | Status: DC | PRN

## 2016-06-19 MED ORDER — ACETAMINOPHEN 325 MG PO TABS
650.0000 mg | ORAL_TABLET | ORAL | Status: DC | PRN
  Administered 2016-06-21: 650 mg via ORAL
  Filled 2016-06-19: qty 2

## 2016-06-19 MED ORDER — PRENATAL MULTIVITAMIN CH
1.0000 | ORAL_TABLET | Freq: Every day | ORAL | Status: DC
  Administered 2016-06-19 – 2016-06-21 (×3): 1 via ORAL
  Filled 2016-06-19 (×3): qty 1

## 2016-06-19 MED ORDER — FERROUS SULFATE 325 (65 FE) MG PO TABS
325.0000 mg | ORAL_TABLET | Freq: Two times a day (BID) | ORAL | Status: DC
  Administered 2016-06-19 – 2016-06-21 (×5): 325 mg via ORAL
  Filled 2016-06-19 (×5): qty 1

## 2016-06-19 MED ORDER — OXYCODONE-ACETAMINOPHEN 5-325 MG PO TABS: 1.0000 | ORAL_TABLET | ORAL | Status: DC | PRN

## 2016-06-19 MED ORDER — LIDOCAINE HCL (PF) 1 % IJ SOLN
30.0000 mL | INTRAMUSCULAR | Status: DC | PRN
  Filled 2016-06-19: qty 30

## 2016-06-19 MED ORDER — MEASLES, MUMPS & RUBELLA VAC ~~LOC~~ INJ
0.5000 mL | INJECTION | Freq: Once | SUBCUTANEOUS | Status: DC
  Filled 2016-06-19: qty 0.5

## 2016-06-19 MED ORDER — LACTATED RINGERS IV SOLN: INTRAVENOUS | Status: DC

## 2016-06-19 MED ORDER — FLEET ENEMA 7-19 GM/118ML RE ENEM: 1.0000 | ENEMA | RECTAL | Status: DC | PRN

## 2016-06-19 MED ORDER — METHYLERGONOVINE MALEATE 0.2 MG PO TABS: 0.2000 mg | ORAL_TABLET | ORAL | Status: DC | PRN

## 2016-06-19 MED ORDER — ACETAMINOPHEN 325 MG PO TABS: 650.0000 mg | ORAL_TABLET | ORAL | Status: DC | PRN

## 2016-06-19 MED ORDER — BENZOCAINE-MENTHOL 20-0.5 % EX AERO
1.0000 "application " | INHALATION_SPRAY | CUTANEOUS | Status: DC | PRN
  Administered 2016-06-19: 1 via TOPICAL
  Filled 2016-06-19: qty 56

## 2016-06-19 MED ORDER — OXYCODONE HCL 5 MG PO TABS
10.0000 mg | ORAL_TABLET | ORAL | Status: DC | PRN
Start: 2016-06-19 — End: 2016-06-21

## 2016-06-19 MED ORDER — BISACODYL 10 MG RE SUPP: 10.0000 mg | Freq: Every day | RECTAL | Status: DC | PRN

## 2016-06-19 MED ORDER — ZOLPIDEM TARTRATE 5 MG PO TABS: 5.0000 mg | ORAL_TABLET | Freq: Every evening | ORAL | Status: DC | PRN

## 2016-06-19 MED ORDER — DOCUSATE SODIUM 100 MG PO CAPS
100.0000 mg | ORAL_CAPSULE | Freq: Two times a day (BID) | ORAL | Status: DC
  Administered 2016-06-19 – 2016-06-21 (×4): 100 mg via ORAL
  Filled 2016-06-19 (×4): qty 1

## 2016-06-19 MED ORDER — DIBUCAINE 1 % RE OINT
1.0000 "application " | TOPICAL_OINTMENT | RECTAL | Status: DC | PRN
  Administered 2016-06-19: 1 via RECTAL
  Filled 2016-06-19: qty 28

## 2016-06-19 MED ORDER — ONDANSETRON HCL 4 MG PO TABS: 4.0000 mg | ORAL_TABLET | ORAL | Status: DC | PRN

## 2016-06-19 MED ORDER — WITCH HAZEL-GLYCERIN EX PADS
1.0000 "application " | MEDICATED_PAD | CUTANEOUS | Status: DC | PRN
  Administered 2016-06-19: 1 via TOPICAL

## 2016-06-19 MED ORDER — METHYLERGONOVINE MALEATE 0.2 MG/ML IJ SOLN: 0.2000 mg | INTRAMUSCULAR | Status: DC | PRN

## 2016-06-19 MED ORDER — OXYTOCIN 40 UNITS IN LACTATED RINGERS INFUSION - SIMPLE MED: 2.5000 [IU]/h | INTRAVENOUS | Status: DC

## 2016-06-19 MED ORDER — TETANUS-DIPHTH-ACELL PERTUSSIS 5-2.5-18.5 LF-MCG/0.5 IM SUSP: 0.5000 mL | Freq: Once | INTRAMUSCULAR | Status: DC

## 2016-06-19 MED ORDER — OXYTOCIN BOLUS FROM INFUSION: 500.0000 mL | Freq: Once | INTRAVENOUS | Status: DC

## 2016-06-19 MED ORDER — FLEET ENEMA 7-19 GM/118ML RE ENEM: 1.0000 | ENEMA | Freq: Every day | RECTAL | Status: DC | PRN

## 2016-06-19 MED ORDER — OXYTOCIN 10 UNIT/ML IJ SOLN
INTRAMUSCULAR | Status: AC
  Administered 2016-06-19: 10 [IU]
  Filled 2016-06-19: qty 1

## 2016-06-19 MED ORDER — COCONUT OIL OIL
1.0000 "application " | TOPICAL_OIL | Status: DC | PRN
  Administered 2016-06-21: 1 via TOPICAL
  Filled 2016-06-19: qty 120

## 2016-06-19 MED ORDER — ONDANSETRON HCL 4 MG/2ML IJ SOLN: 4.0000 mg | Freq: Four times a day (QID) | INTRAMUSCULAR | Status: DC | PRN

## 2016-06-19 MED ORDER — DIPHENHYDRAMINE HCL 25 MG PO CAPS: 25.0000 mg | ORAL_CAPSULE | Freq: Four times a day (QID) | ORAL | Status: DC | PRN

## 2016-06-19 MED ORDER — ONDANSETRON HCL 4 MG/2ML IJ SOLN: 4.0000 mg | INTRAMUSCULAR | Status: DC | PRN

## 2016-06-19 MED ORDER — OXYCODONE-ACETAMINOPHEN 5-325 MG PO TABS: 2.0000 | ORAL_TABLET | ORAL | Status: DC | PRN

## 2016-06-19 MED ORDER — IBUPROFEN 600 MG PO TABS
600.0000 mg | ORAL_TABLET | Freq: Four times a day (QID) | ORAL | Status: DC
  Administered 2016-06-19 – 2016-06-21 (×10): 600 mg via ORAL
  Filled 2016-06-19 (×9): qty 1

## 2016-06-19 MED ORDER — SIMETHICONE 80 MG PO CHEW: 80.0000 mg | CHEWABLE_TABLET | ORAL | Status: DC | PRN

## 2016-06-19 NOTE — H&P (Signed)
[name] [name] is a 25 y.o. female [phone] with IUP at 4036w0d presenting for contractions. Pt states she has been having regular, every 2  minutes contractions, associated with none vaginal bleeding for 1.5 hours..  Membranes are intact, with active fetal movement.   PNCare at HD  Prenatal History/Complications:  none  Past Medical History: Past Medical History:  Diagnosis Date  . Medical history non-contributory     Past Surgical History: Past Surgical History:  Procedure Laterality Date  . DILATION AND CURETTAGE OF UTERUS    . NO PAST SURGERIES      Obstetrical History: OB History    Gravida Para Term Preterm AB Living   4 1 1  0 2 1   SAB TAB Ectopic Multiple Live Births   1 1 0   1       Social History: Social History   Social History  . Marital status: Single    Spouse name: N/A  . Number of children: N/A  . Years of education: N/A   Social History Main Topics  . Smoking status: Never Smoker  . Smokeless tobacco: None  . Alcohol use No  . Drug use: No  . Sexual activity: Yes    Birth control/ protection: Pill, None   Other Topics Concern  . None   Social History Narrative   ** Merged History Encounter **        Family History: Family History  Problem Relation Age of Onset  . Heart disease Father     Allergies: No Known Allergies  Prescriptions Prior to Admission  Medication Sig Dispense Refill Last Dose  . ferrous sulfate 325 (65 FE) MG tablet Take 325 mg by mouth daily with breakfast.   06/18/2016  . Prenatal Vit-Fe Fumarate-FA (PRENATAL MULTIVITAMIN) TABS tablet Take 1 tablet by mouth daily at 12 noon.   06/18/2016     Prenatal Transfer Tool  Maternal Diabetes: No Genetic Screening: Normal Maternal Ultrasounds/Referrals: Normal Fetal Ultrasounds or other Referrals:  None Maternal Substance Abuse:  No Significant Maternal Medications:  None Significant Maternal Lab Results: Lab values include: Group B Strep positive     Review  of Systems   Constitutional: Negative for fever and chills Eyes: Negative for visual disturbances Respiratory: Negative for shortness of breath, dyspnea Cardiovascular: Negative for chest pain or palpitations  Gastrointestinal: Negative for vomiting, diarrhea and constipation.  POSITIVE for abdominal pain (contractions) Genitourinary: Negative for dysuria and urgency Musculoskeletal: Negative for back pain, joint pain, myalgias  Neurological: Negative for dizziness and headaches      Temperature 97.7 F (36.5 C), resp. rate 20, last menstrual period 09/26/2015, unknown if currently breastfeeding. General appearance: alert, cooperative and no distress Lungs: clear to auscultation bilaterally Heart: regular rate and rhythm Abdomen: soft, non-tender; bowel sounds normal Extremities: Homans sign is negative, no sign of DVT DTR's 2+ Presentation: cephalic Fetal monitoring  Baseline: 150 bpm, Variability: Good {> 6 bpm), Accelerations: Reactive and Decelerations: Absent Uterine activity  None Dilation: 9 Effacement (%): 100 Station: 0   Prenatal labs: ABO, Rh: A/Positive/-- (04/06 0000) Antibody: Negative (04/06 0000) Rubella: !Error! RPR: Nonreactive (04/06 0000)  HBsAg: Negative (04/06 0000)  HIV: Non-reactive (04/06 0000)  GBS: Positive (09/07 0000)  1 hr Glucola 90 Genetic screening  neg Anatomy US normal   No results found for this or any previous visit (from the past 24 hour(s)).  Assessment: [name] [name] is a 25 y.o. [phone] with an IUP at 436w0d presenting for active labor  Plan: #Labor:  expectant management #Pain:  Per request #FWB Cat 1 #ID: GBS: too late, delivery imminent  #MOF:  breast #MOC: depo #Circ: no   CRESENZO-DISHMAN,Shellia Hartl 06/19/2016, 1:31 AM

## 2016-06-19 NOTE — Lactation Note (Signed)
This note was copied from a baby's chart. Lactation Consultation Note  Patient Name: [name] [name] ZOXWR'UToday's Date: 06/19/2016 ReaPrince Romeson for consult: Initial assessment  Initial visit at 13 hours of life. Mom is a P2 who lactated for 10 months w/her 1st child. Mom had an abundant supply w/her 1st child. When pumping, she pumped 24 oz/session (Mom stopped pumping at 10 months, but 1st child continued to receive EBM for an additional 4 months). Mom did feed 1st child at the breast for 3 months.   Hand expression was done w/Mom. Her colostrum flows very easily. Mom reports that this infant is nursing very well.   Mom made aware of O/P services & and our phone # for post-discharge questions. Interpreter, Bonnye FavaViria, present during consult.   Lurline HareRichey, Johnathen Testa [facility] 06/19/2016, 2:30 PM

## 2016-06-20 NOTE — Progress Notes (Signed)
Patient ID: [name] [name], female   DOB: 16-Mar-1991, 25 y.o.   MRN: [id number]  POSTPARTUM PROGRESS NOTE  Post Partum Day #1 Subjective:  [name] [name] is a 25 y.o. [id number] 5576w1d s/p NSVD.  No acute events overnight.  Pt denies problems with ambulating, voiding or po intake.  She denies nausea or vomiting.  Pain is well controlled.  She has had flatus. She has had bowel movement.  Lochia Minimal.   Objective: Blood pressure (!) 100/53, pulse 70, temperature 98.9 F (37.2 C), temperature source Oral, resp. rate 18, height 5' (1.524 m), weight 115 lb (52.2 kg), last menstrual period 09/26/2015, SpO2 100 %, unknown if currently breastfeeding.  Physical Exam:  General: alert, cooperative and no distress Lochia:normal flow Chest: CTAB Heart: RRR no m/r/g Abdomen: +BS, soft, nontender,  Uterine Fundus: firm, below umbilicus DVT Evaluation: No calf swelling or tenderness Extremities: Minimal edema   Recent Labs  06/19/16 0212  HGB 9.7*  HCT 28.8*    Assessment/Plan:  ASSESSMENT: [name] [name] is a 25 y.o. [id number] 176w1d s/p NSVD  Plan for discharge tomorrow, Breastfeeding and Contraception Depo Provera   LOS: 1 day   Jen MowElizabeth Mumaw, DO 06/20/2016, 12:14 PM

## 2016-06-20 NOTE — Lactation Note (Signed)
This note was copied from a baby's chart. Lactation Consultation Note  Patient Name: [name] [name] ZOXWR'UToday's Date: 06/20/2016 Reason for consult: Follow-up assessment Baby is 7839 hours old [facility]. Alex, interpreter helped during consult. Mom was BF baby on left breast in cradle hold when LC entered; mom reports that baby had been on that breast for ~20 mins and so his suckling had slowed (did not do latch score since it was the end of the feeding). Mom reports mild tenderness sometimes but overall, reports things are going well. Mom reports that sometimes baby falls asleep at the breast & so it can take ~25 mins to feed him; discussed waking techniques. Mom has WIC. Mom reports no questions at this time. Encouraged mom to ask for LC at future feeds if assistance is needed.  Maternal Data    Feeding    LATCH Score/Interventions                      Lactation Tools Discussed/Used WIC Program: Yes   Consult Status Consult Status: Follow-up Date: 06/21/16 Follow-up type: In-patient    Oneal GroutLaura C Terrionna Bridwell 06/20/2016, 5:26 PM

## 2016-06-21 ENCOUNTER — Ambulatory Visit: Payer: Self-pay

## 2016-06-21 MED ORDER — IBUPROFEN 600 MG PO TABS: 600.0000 mg | ORAL_TABLET | Freq: Four times a day (QID) | ORAL | 0 refills | Status: DC

## 2016-06-21 NOTE — Progress Notes (Signed)
Post discharge chart review completed.  

## 2016-06-21 NOTE — Lactation Note (Signed)
This note was copied from a baby's chart. Lactation Consultation Note  Patient Name: [name] [name] WUJWJ'XToday's Date: 06/21/2016 Reason for consult: Follow-up assessment [name] HuhMarta, the in-house Spanish interpreter present for visit. Mom recently BF baby and baby now asleep. Breasts still very full. Had Mom pump with DEBP while LC used massage to soften breasts. After pumping for 15 minutes then LC used reverse pressure massage and had Mom pump again. Both breasts softening but fullness not completely resolved. Mom reports feeling better. Ice packs applied.  Plan for Mom: Continue to BF with feeding ques but if breast full by 2 hours then pre-pump to soften nipple/aerola and see if baby will BF.  If baby will not BF, pump to comfort and apply ice packs.  With latch, be sure baby is getting good depth so he will empty breasts well. Use breast compression Apply EBM/coconut oil for nipple tenderness.  Do not give baby EBM via bottles when breasts are so full. Baby needs to be at breast to regulate milk volume. Baby should be at breast 8-12 times in 24 hours and with feeding ques. If BF, pumping, ice, Motrin are not relieving engorgement, use cold cabbage leaves 1-2 times today. Advised to be careful, can dry milk. WIC loaner pump rental complete to help Mom manage engorgement. Advised of OP services and support group. Call for questions/concerns. Breast milk storage guidelines - refer to Baby N Me booklet, page 25. Engorgement care, refer to page 24 Baby N Me booklet.   Maternal Data    Feeding Feeding Type: Breast Fed Length of feed: 5 min  LATCH Score/Interventions          Problem noted: Engorgment Intervention(s): Ice;Hand expression Intervention(s): Double electric pump           Lactation Tools Discussed/Used Tools: Pump Breast pump type: Double-Electric Breast Pump   Consult Status Consult Status: Complete Date: 06/21/16 Follow-up type: In-patient    [name] [name],  [name] [name] 06/21/2016, 2:09 PM

## 2016-06-21 NOTE — Discharge Summary (Signed)
OB Discharge Summary  Patient Name: [name] [name] DOB: [birth year] MRN: [id number]  Date of admission: 06/19/2016 Delivering MD: Jacklyn Shell   Date of discharge: 06/21/2016  Admitting diagnosis: 41 WEEKS CTX  Intrauterine pregnancy: [redacted]w[redacted]d     Secondary diagnosis:Active Problems:   Normal spontaneous vaginal delivery  Additional problems: None    Discharge diagnosis: Term Pregnancy Delivered                                                                     Post partum procedures:None  Augmentation: none  Complications: None  Hospital course:  Onset of Labor With Vaginal Delivery     25 y.o. yo [id number] at [redacted]w[redacted]d was admitted in Active Labor on 06/19/2016. Patient had an uncomplicated labor course as follows:  Membrane Rupture Time/Date: 1:10 AM ,06/19/2016   Intrapartum Procedures: Episiotomy: None [1]                                         Lacerations:  None [1]  Patient had a delivery of a Viable infant. 06/19/2016  Information for the patient's newborn:  Dayan, Kreis [[id number]]  Delivery Method: Vaginal, Spontaneous Delivery (Filed from Delivery Summary)    Pateint had an uncomplicated postpartum course.  She is ambulating, tolerating a regular diet, passing flatus, and urinating well. Patient is discharged home in stable condition on 06/21/16.    Physical exam Vitals:   06/19/16 1839 06/20/16 0511 06/20/16 1801 06/21/16 0536  BP: (!) 104/59 (!) 100/53 (!) 104/56 (!) 107/53  Pulse: 60 70 77 67  Resp: 18 18 17 16   Temp: 98 F (36.7 C) 98.9 F (37.2 C) 98.3 F (36.8 C) 98.3 F (36.8 C)  TempSrc: Oral Oral Oral Oral  SpO2:      Weight:      Height:       General: alert, cooperative and no distress Lochia: appropriate Uterine Fundus: firm Incision: N/A DVT Evaluation: No evidence of DVT seen on physical exam. Labs: Lab Results  Component Value Date   WBC 7.8 06/19/2016   HGB 9.7 (L) 06/19/2016   HCT 28.8 (L) 06/19/2016   MCV  88.9 06/19/2016   PLT 180 06/19/2016   CMP Latest Ref Rng & Units 06/30/2015  Glucose 65 - 99 mg/dL 846(N)  BUN 6 - 20 mg/dL 16  Creatinine 6.29 - 5.28 mg/dL 4.13  Sodium 244 - 010 mmol/L 137  Potassium 3.5 - 5.1 mmol/L 4.1  Chloride 101 - 111 mmol/L 108  CO2 22 - 32 mmol/L 23  Calcium 8.9 - 10.3 mg/dL 9.0  Total Protein 6.5 - 8.1 g/dL 7.5  Total Bilirubin 0.3 - 1.2 mg/dL 0.3  Alkaline Phos 38 - 126 U/L 69  AST 15 - 41 U/L 94(H)  ALT 14 - 54 U/L 63(H)    Discharge instruction: per After Visit Summary and "Baby and Me Booklet".  After Visit Meds:    Medication List    TAKE these medications   ferrous sulfate 325 (65 FE) MG tablet Take 325 mg by mouth daily with breakfast.   ibuprofen 600 MG tablet Commonly known as:  ADVIL,MOTRIN Take  1 tablet (600 mg total) by mouth every 6 (six) hours.   prenatal multivitamin Tabs tablet Take 1 tablet by mouth daily at 12 noon.   pseudoephedrine 30 MG tablet Commonly known as:  SUDAFED Take 30 mg by mouth every 4 (four) hours as needed for congestion.       Diet: low salt diet  Activity: Advance as tolerated. Pelvic rest for 6 weeks.   Outpatient follow up:6 weeks Follow up Appt:No future appointments. Follow up visit: No Follow-up on file.  Postpartum contraception: Depo Provera  Newborn Data: Live born female  Birth Weight: 6 lb 15.6 oz (3165 g) APGAR: 9, 9  Baby Feeding: Breast Disposition:home with mother   06/21/2016 Hilton SinclairKaty D Mayo, MD  OB FELLOW DISCHARGE ATTESTATION  I have seen and examined this patient and agree with above documentation in the resident's note.   Jen MowElizabeth Mumaw, DO OB Fellow

## 2016-06-21 NOTE — Discharge Instructions (Signed)
Parto vaginal, Cuidados posteriores  °(Vaginal Delivery, Care After) °Siga estas instrucciones durante las próximas semanas. Estas indicaciones para el alta le proporcionan información general acerca de cómo deberá cuidarse después del parto. El médico también podrá darle instrucciones específicas. El tratamiento ha sido planificado según las prácticas médicas actuales, pero en algunos casos pueden ocurrir problemas. Comuníquese con el médico si tiene algún problema o tiene preguntas al volver a su casa.  °INSTRUCCIONES PARA EL CUIDADO EN EL HOGAR  °· Tome sólo medicamentos de venta libre o recetados, según las indicaciones del médico o del farmacéutico. °· No beba alcohol, especialmente si está amamantando o toma analgésicos. °· No mastique tabaco ni fume. °· No consuma drogas. °· Continúe con un adecuado cuidado perineal. El buen cuidado perineal incluye: °¨ Higienizarse de adelante hacia atrás. °¨ Mantener la zona perineal limpia. °· No use tampones ni duchas vaginales hasta que su médico la autorice. °· Dúchese, lávese el cabello y tome baños de inmersión según las indicaciones de su médico. °· Utilice un sostén que le ajuste bien y que brinde buen soporte a sus mamas. °· Consuma alimentos saludables. °· Beba suficiente líquido para mantener la orina clara o de color amarillo pálido. °· Consuma alimentos ricos en fibra como cereales y panes integrales, arroz, frijoles y frutas y verduras frescas todos los días. Estos alimentos pueden ayudarla a prevenir o aliviar el estreñimiento. °· Siga las recomendaciones de su médico relacionadas con la reanudación de actividades como subir escaleras, conducir automóviles, levantar objetos, hacer ejercicios o viajar. °· Hable con su médico acerca de reanudar la actividad sexual. Volver a la actividad sexual depende del riesgo de infección, la velocidad de la curación y la comodidad y su deseo de reanudarla. °· Trate de que alguien la ayude con las actividades del hogar y con  el recién nacido al menos durante un par de días después de salir del hospital. °· Descanse todo lo que pueda. Trate de descansar o tomar una siesta mientras el bebé está durmiendo. °· Aumente sus actividades gradualmente. °· Cumpla con todas las visitas de control programadas para después del parto. Es muy importante asistir a todas las citas programadas de seguimiento. En estas citas, su médico va a controlarla para asegurarse de que esté sanando física y emocionalmente. °SOLICITE ATENCIÓN MÉDICA SI:  °· Elimina coágulos grandes por la vagina. Guarde algunos coágulos para mostrarle al médico. °· Tiene una secreción con feo olor que proviene de la vagina. °· Tiene dificultad para orinar. °· Orina con frecuencia. °· Siente dolor al orinar. °· Nota un cambio en sus movimientos intestinales. °· Aumenta el enrojecimiento, el dolor o la hinchazón en la zona de la incisión vaginal (episiotomía) o el desgarro vaginal. °· Tiene pus que drena por la episiotomía o el desgarro vaginal. °· La episiotomía o el desgarro vaginal se abren. °· Sus mamas le duelen, están duras o enrojecidas. °· Sufre un dolor intenso de cabeza. °· Tiene visión borrosa o ve manchas. °· Se siente triste o deprimida. °· Tiene pensamientos acerca de lastimarse o dañar al recién nacido. °· Tiene preguntas acerca de su cuidado personal, el cuidado del recién nacido o acerca de los medicamentos. °· Se siente mareada o sufre un desmayo. °· Tiene una erupción. °· Tiene náuseas o vómitos. °· Usted amamantó al bebé y no ha tenido su período menstrual dentro de las 12 semanas después de dejar de amamantar. °· No amamanta al bebé y no tuvo su período menstrual en las últimas 12° semanas después del   parto. °· Tiene fiebre. °SOLICITE ATENCIÓN MÉDICA DE INMEDIATO SI:  °· Siente dolor persistente. °· Siente dolor en el pecho. °· Le falta el aire. °· Se desmaya. °· Siente dolor en la pierna. °· Siente dolor en el estómago. °· El sangrado vaginal satura dos o más  apósitos en 1 hora. °  °Esta información no tiene como fin reemplazar el consejo del médico. Asegúrese de hacerle al médico cualquier pregunta que tenga. °  °Document Released: 08/24/2005 Document Revised: 05/15/2015 °Elsevier Interactive Patient Education ©2016 Elsevier Inc. ° °

## 2016-06-21 NOTE — Progress Notes (Signed)
Patient is pumping and gaining some relief with engorgement. She has expressed 30 ml times 2 with pumping. Will take home a manual pump and contact WIC for loaner pump if engorgement is not resolved. Ice packs applied to reduce swelling. Taking Ibuprofen as ordered.

## 2016-06-21 NOTE — Lactation Note (Signed)
This note was copied from a baby's chart. Lactation Consultation Note  Patient Name: [name] [name] ZOXWR'UToday's Date: 06/21/2016 Reason for consult: Follow-up assessment Mom latching baby in cradle hold, baby having difficulty obtaining good depth. Assisted Mom to position baby in cross cradle. Mom is engorged. Mom had pre-pumped prior to this feeding so nipple/aerola soft but with swelling. Positional stripes bilateral nipples. Mom using EBM/coconut oil for tenderness. Baby demonstrated good suckling bursts with audible swallows. Breast softening but still very full. Advised Mom to post pump for 15 minutes after this feeding using good massage then apply ice packs given to her. LC will return for follow up.  Maternal Data    Feeding Feeding Type: Breast Fed Length of feed: 30 min  LATCH Score/Interventions Latch: Grasps breast easily, tongue down, lips flanged, rhythmical sucking. Intervention(s): Adjust position;Assist with latch;Breast massage;Breast compression  Audible Swallowing: Spontaneous and intermittent  Type of Nipple: Everted at rest and after stimulation  Comfort (Breast/Nipple): Engorged, cracked, bleeding, large blisters, severe discomfort Problem noted: Engorgment Intervention(s): Ice;Hand expression Intervention(s): Double electric pump  Problem noted: Cracked, bleeding, blisters, bruises;Mild/Moderate discomfort Interventions  (Cracked/bleeding/bruising/blister): Expressed breast milk to nipple Interventions (Mild/moderate discomfort): Post-pump  Hold (Positioning): Assistance needed to correctly position infant at breast and maintain latch.  LATCH Score: 7  Lactation Tools Discussed/Used WIC Program: Yes Pump Review: Setup, frequency, and cleaning;Milk Storage Initiated by:: BDaly, RN Date initiated:: 06/21/16   Consult Status Consult Status: Follow-up Date: 06/21/16 Follow-up type: In-patient    [name], Meli Faley Ann 06/21/2016, 11:21 AM

## 2016-08-11 IMAGING — US US MFM OB FOLLOW-UP
1 series · 14 of 28 positions shown · non-contrast
Comparison: none

[Series 1: us mfm ob follow-up · 106 acquisitions, 14 frames shown]
[im 4/106]
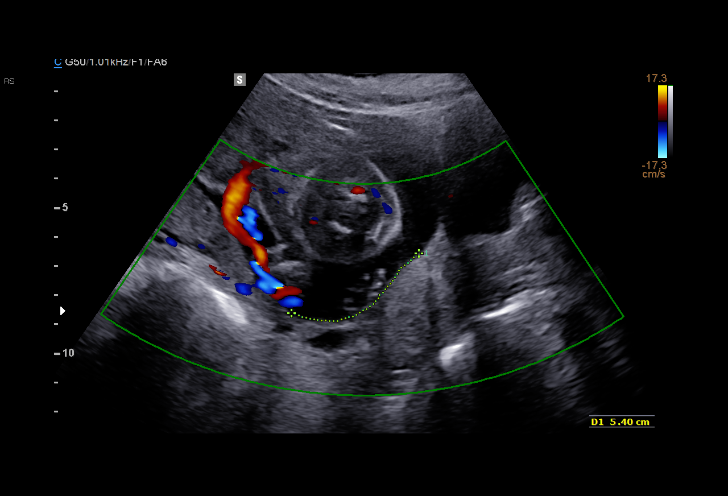
[im 12/106]
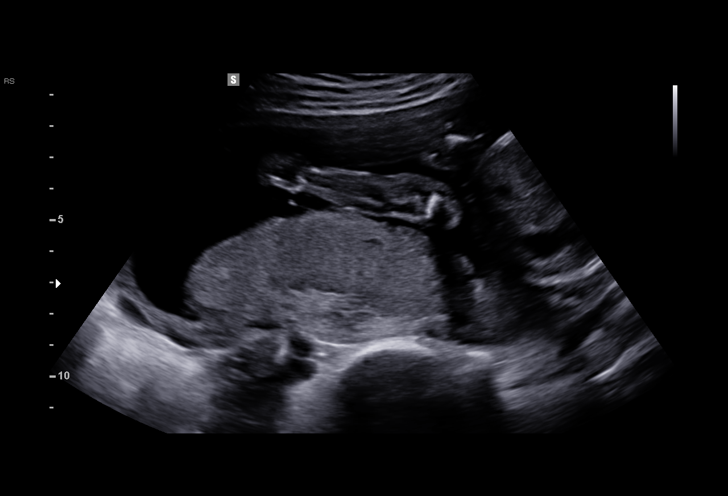
[im 20/106]
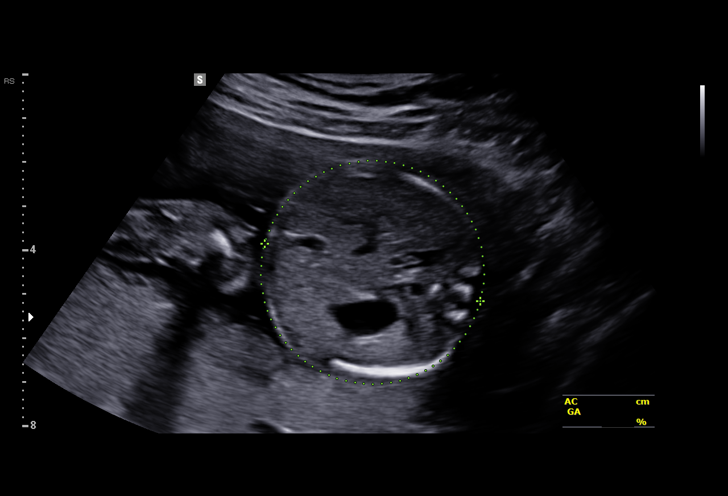
[im 28/106]
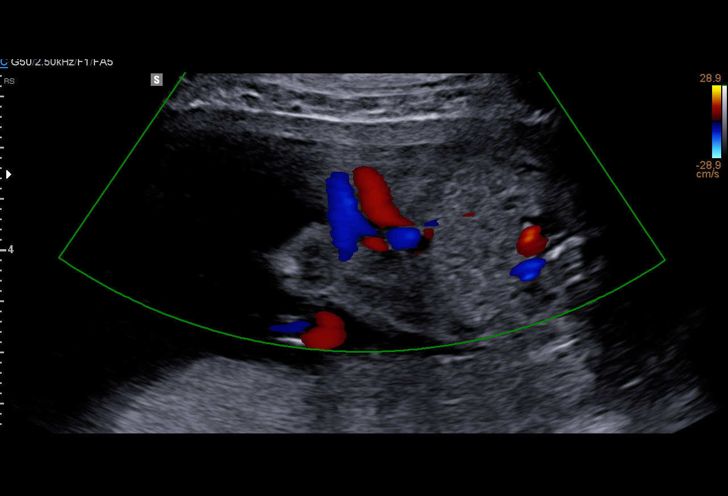
[im 36/106]
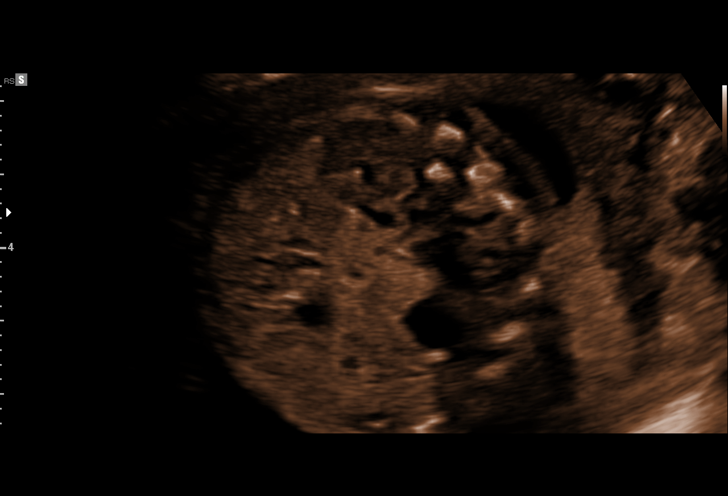
[im 43/106]
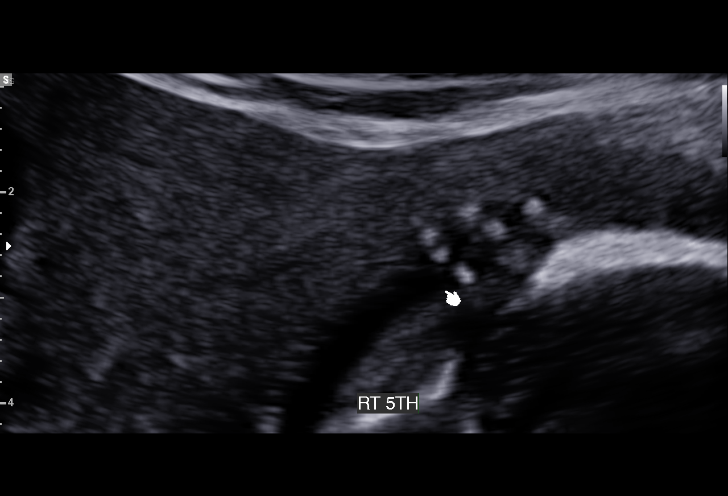
[im 51/106]
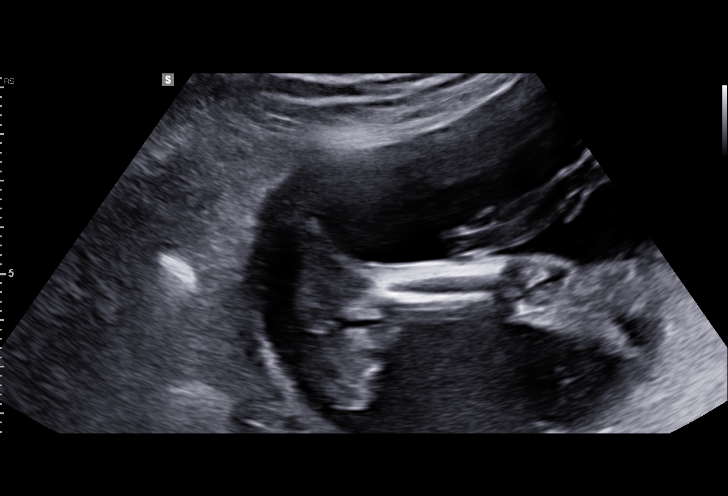
[im 59/106]
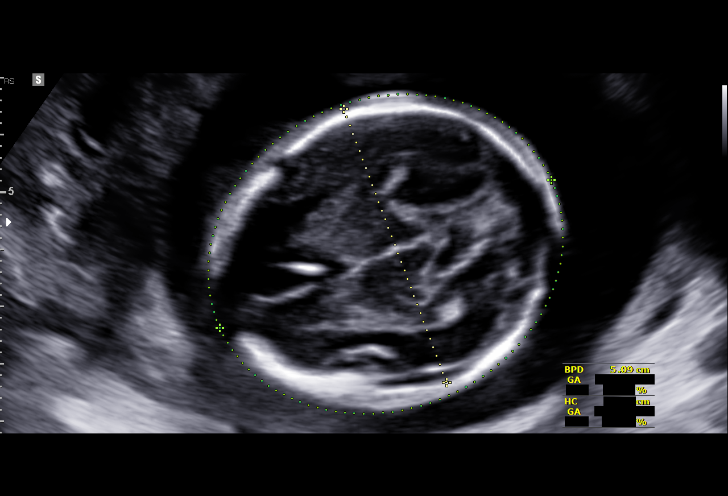
[im 67/106]
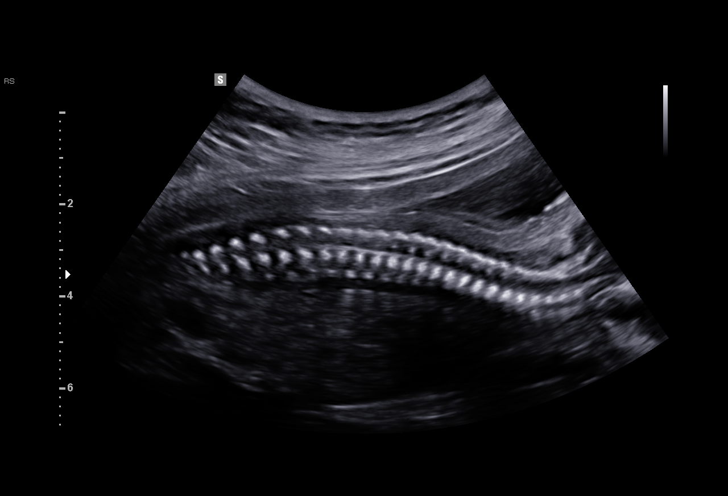
[im 74/106]
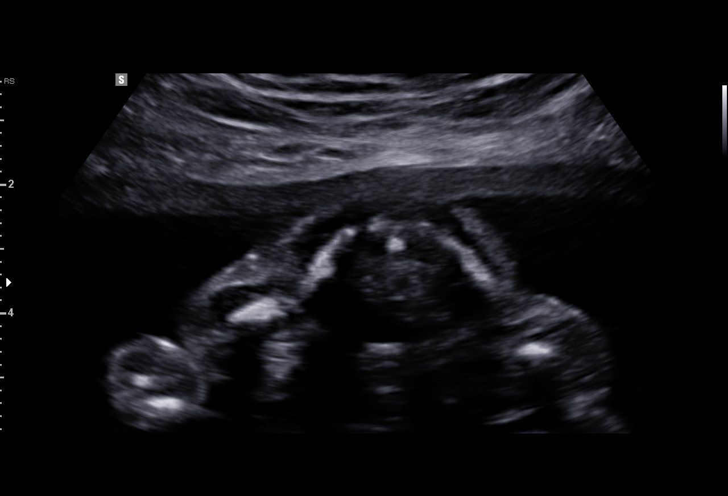
[im 82/106]
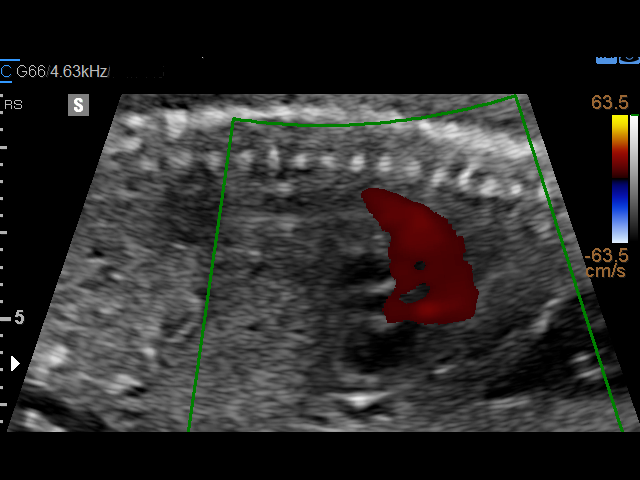
[im 90/106]
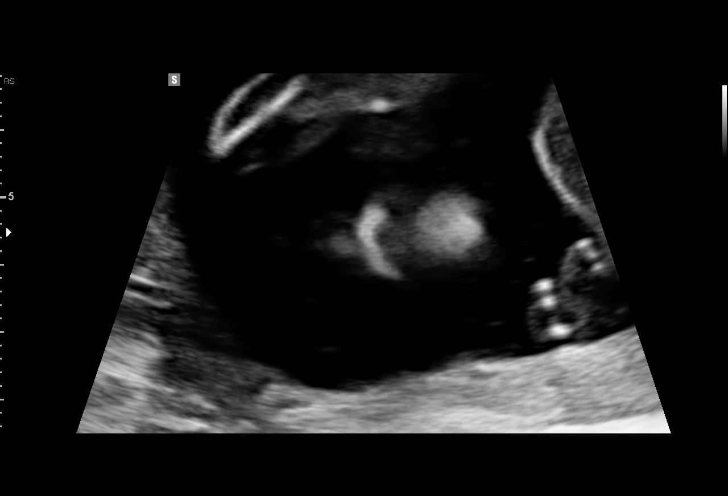
[im 98/106]
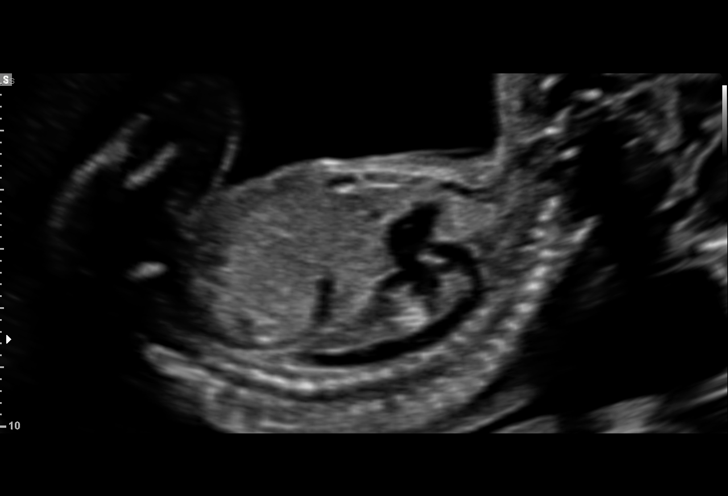
[im 106/106]
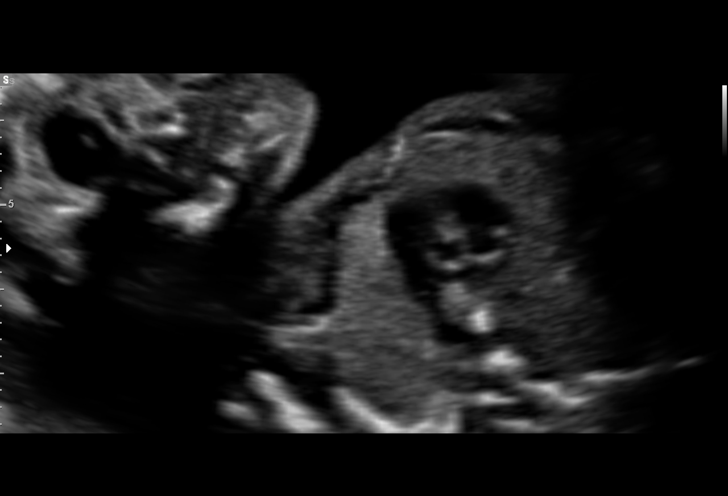

[14 of 28 positions shown; findings below may reference images not displayed]

1  ANOCHT PASSE            [id number]      [id number]     [id number]
Indications

20 weeks gestation of pregnancy
Follow-up incomplete fetal anatomic            Z36
evaluation
OB History

Gravidity:    2         Term:   1        Prem:   0        SAB:   0
TOP:          0       Ectopic:  0        Living: 1
Fetal Evaluation

Num Of Fetuses:     1
Fetal Heart         129
Rate(bpm):
Cardiac Activity:   Observed
Presentation:       Cephalic
Placenta:           Posterior, above cervical os
P. Cord Insertion:  Previously Visualized

Amniotic Fluid
AFI FV:      Subjectively within normal limits
Biometry

BPD:      50.9  mm     G. Age:  21w 3d         77  %    CI:        76.34   %   70 - 86
FL/HC:      16.6   %   15.9 -
HC:      184.6  mm     G. Age:  20w 6d         46  %    HC/AC:      1.15       1.06 -
AC:      159.9  mm     G. Age:  21w 1d         56  %    FL/BPD:     60.3   %
FL:       30.7  mm     G. Age:  19w 4d          9  %    FL/AC:      19.2   %   20 - 24
HUM:      29.2  mm     G. Age:  19w 4d         17  %
CER:      20.8  mm     G. Age:  19w 6d         29  %
NFT:       5.6  mm
CM:        4.8  mm

Est. FW:     355  gm    0 lb 13 oz      40  %
Gestational Age

LMP:           17w 6d       Date:   09/26/15                 EDD:   07/02/16
U/S Today:     20w 5d                                        EDD:   06/12/16
Best:          20w 5d    Det. By:   U/S  (12/27/15)          EDD:   06/12/16
Anatomy

Cranium:               Appears normal         Aortic Arch:            Appears normal
Cavum:                 Appears normal         Ductal Arch:            Appears normal
Ventricles:            Appears normal         Diaphragm:              Appears normal
Choroid Plexus:        Appears normal         Stomach:                Appears normal, left
sided
Cerebellum:            Appears normal         Abdomen:                Appears normal
Posterior Fossa:       Appears normal         Abdominal Wall:         Appears nml (cord
insert, abd wall)
Nuchal Fold:           Appears normal         Cord Vessels:           Appears normal (3
vessel cord)
Face:                  Appears normal         Kidneys:                Appear normal
(orbits and profile)
Lips:                  Appears normal         Bladder:                Appears normal
Thoracic:              Appears normal         Spine:                  Appears normal
Heart:                 Appears normal         Upper Extremities:      Appears normal
(4CH, axis, and
situs)
RVOT:                  Appears normal         Lower Extremities:      Appears normal
LVOT:                  Appears normal

Other:  Fetus appears to be a male. Heels and 5th digit visualized.
Cervix Uterus Adnexa

Cervix
Normal appearance by transabdominal scan.

Uterus
No abnormality visualized.

Left Ovary
Not visualized.

Right Ovary
Not visualized.

Adnexa:       No abnormality visualized. No adnexal mass
visualized.
Impression

Single IUP at 20w 5d
Normal interval anatomy - the anatomic survey is now
complete
Fetal growth is appropriate (40th %tile)
Posterior placenta without previa
Normal amniotic fluid volume
Recommendations
Follow-up ultrasounds as clinically indicated.

## 2016-10-31 ENCOUNTER — Encounter (HOSPITAL_COMMUNITY): Payer: Self-pay

## 2018-02-02 ENCOUNTER — Encounter: Payer: Self-pay | Admitting: Internal Medicine

## 2018-02-02 ENCOUNTER — Ambulatory Visit (INDEPENDENT_AMBULATORY_CARE_PROVIDER_SITE_OTHER): Payer: Self-pay | Admitting: Internal Medicine

## 2018-02-02 VITALS — BP 102/68 | HR 72 | Resp 12 | Ht 58.25 in | Wt 109.0 lb

## 2018-02-02 DIAGNOSIS — N898 Other specified noninflammatory disorders of vagina: Secondary | ICD-10-CM

## 2018-02-02 DIAGNOSIS — K029 Dental caries, unspecified: Secondary | ICD-10-CM

## 2018-02-02 DIAGNOSIS — M545 Low back pain, unspecified: Secondary | ICD-10-CM

## 2018-02-02 LAB — POCT WET PREP WITH KOH
KOH PREP POC: NEGATIVE
RBC Wet Prep HPF POC: NEGATIVE
Trichomonas, UA: NEGATIVE

## 2018-02-02 LAB — POCT URINALYSIS DIPSTICK
Bilirubin, UA: NEGATIVE
GLUCOSE UA: NEGATIVE
KETONES UA: NEGATIVE
Nitrite, UA: NEGATIVE
Protein, UA: POSITIVE — AB
SPEC GRAV UA: 1.01 (ref 1.010–1.025)
Urobilinogen, UA: 0.2 E.U./dL
pH, UA: 7 (ref 5.0–8.0)

## 2018-02-02 MED ORDER — METRONIDAZOLE 500 MG PO TABS: ORAL_TABLET | ORAL | 0 refills | Status: DC

## 2018-02-02 NOTE — Progress Notes (Signed)
Subjective:    Patient ID: [name] [name], female    DOB: Sep 27, 1990, 27 y.o.   MRN: [id number]  HPI   Here to establish  1.  Vaginal itching and white discharge for 2 weeks.  No odor.  No pelvic pain associated with discharge.  Had this previously in December 2018.   Has not been on any medication prior, including antibiotics.   No recent swimsuit wear. No different underwear.  Has cotton crotch panel. Somewhat tight pants. Wears underwear and shorts to bed.   She is sexually active --last intercourse with partner was about 3 weeks ago. They have been together for 6 years.  He has not had any symptoms and she has asked him directly She is using Depo Provera.  She obtains at [facility] clinic.  Last injection was 2 months ago. She has been utilizing Depo Provera for a year. Started last period 01/30/2018, did not have flow on the 27th.  She had flow again on the 28th.  Today, no flow.  Periods are irregular. Has been using an OTC herbal remedy with "antibiotic properties"  Ampitrexyl 500 mg twice daily.  Unable to get active ingredients. Also was taking Ibuprofen and Tylenol and during exam gives history of having pyelonephritis in the past and also give history that she developed bilateral low back pain with onset of vaginal discharge and itching.  No outpatient medications have been marked as taking for the 02/02/18 encounter (Office Visit) with Julieanne Manson, MD.    No Known Allergies   History reviewed. No pertinent past medical history.   Past Surgical History:  Procedure Laterality Date  . DILATION AND CURETTAGE OF UTERUS  2009   ?for something similar to a cyst per patient.   Family History  Problem Relation Age of Onset  . Heart disease Father        rhythm issues   Social History   Socioeconomic History  . Marital status: Media planner    Spouse name: Metro Kung  . Number of children: 2  . Years of education: Not on file  . Highest education level: 12th  grade  Occupational History  . Occupation: Housekeeping-homes  Social Needs  . Financial resource strain: Not on file  . Food insecurity:    Worry: Never true    Inability: Not on file  . Transportation needs:    Medical: No    Non-medical: No  Tobacco Use  . Smoking status: Never Smoker  . Smokeless tobacco: Never Used  Substance and Sexual Activity  . Alcohol use: Yes    Comment: sometimes  . Drug use: No  . Sexual activity: Yes    Birth control/protection: Injection  Lifestyle  . Physical activity:    Days per week: Not on file    Minutes per session: Not on file  . Stress: Not on file  Relationships  . Social connections:    Talks on phone: Not on file    Gets together: Not on file    Attends religious service: Not on file    Active member of club or organization: Not on file    Attends meetings of clubs or organizations: Not on file    Relationship status: Not on file  . Intimate partner violence:    Fear of current or ex partner: No    Emotionally abused: No    Physically abused: No    Forced sexual activity: No  Other Topics Concern  . Not on file  Social History  Narrative      Lives at home with her long term boyfriend and their 2 daughters.            Review of Systems     Objective:   Physical Exam NAD HEENT:  PERRL, EOMI Neck:  Supple, No adenopathy Chest:  CTA CV:  RRR with normal S1 and S2, No S3, S4 or murmur.  Radial and DP pulses normal and equal Abd:  S, mild bilateral tenderness without rebound or peritoneal signs.  No flank tenderness.  + BS, No HSM or mass. MS:  Mild tenderness of bilateral paraspinous musculature. GU:  Mild white thin discharge.  No odor.  Mild underlying vaginal inflammation.  No CMT.  No uterine or left adnexal mass or tenderness.  Mild tenderness without mass on palpation of right adnexal area.   Wet prep:  Do not see definitive budding yeast and no hyphae.  Do see clue cells and slight whiff + with addition of  KOH.    Assessment & Plan:  1.  Vaginal discharge:  Most likely just BV.  Metronidazole 500 mg twice daily for 7 days.  To call if symptoms do not resolve. Sending GC/chlamydia  2.  Possible UTI as well.  Send urine for culture.  Push fluids.  3.  Brings up request for dental referral at end of visit:  Done.

## 2018-02-04 LAB — URINE CULTURE: ORGANISM ID, BACTERIA: NO GROWTH

## 2018-02-04 LAB — GC/CHLAMYDIA PROBE AMP
CHLAMYDIA, DNA PROBE: NEGATIVE
Neisseria gonorrhoeae by PCR: NEGATIVE

## 2018-10-29 ENCOUNTER — Emergency Department (HOSPITAL_COMMUNITY)
Admission: EM | Admit: 2018-10-29 | Discharge: 2018-10-29 | Disposition: A | Discharge status: Home / Self Care | Payer: Self-pay | Attending: Emergency Medicine | Admitting: Emergency Medicine

## 2018-10-29 ENCOUNTER — Encounter (HOSPITAL_COMMUNITY): Payer: Self-pay | Admitting: Emergency Medicine

## 2018-10-29 DIAGNOSIS — N644 Mastodynia: Secondary | ICD-10-CM | POA: Insufficient documentation

## 2018-10-29 DIAGNOSIS — N3000 Acute cystitis without hematuria: Secondary | ICD-10-CM | POA: Insufficient documentation

## 2018-10-29 LAB — URINALYSIS, ROUTINE W REFLEX MICROSCOPIC
Bacteria, UA: NONE SEEN
Bilirubin Urine: NEGATIVE
GLUCOSE, UA: NEGATIVE mg/dL
Ketones, ur: NEGATIVE mg/dL
Nitrite: NEGATIVE
PH: 6 (ref 5.0–8.0)
Protein, ur: NEGATIVE mg/dL
Specific Gravity, Urine: 1.019 (ref 1.005–1.030)

## 2018-10-29 LAB — WET PREP, GENITAL
Clue Cells Wet Prep HPF POC: NONE SEEN
Sperm: NONE SEEN
Trich, Wet Prep: NONE SEEN
Yeast Wet Prep HPF POC: NONE SEEN

## 2018-10-29 LAB — I-STAT BETA HCG BLOOD, ED (MC, WL, AP ONLY): I-stat hCG, quantitative: <5 m[IU]/mL (ref <5)

## 2018-10-29 MED ORDER — CEPHALEXIN 500 MG PO CAPS
500.0000 mg | ORAL_CAPSULE | Freq: Once | ORAL | Status: AC
  Administered 2018-10-29: 500 mg via ORAL
  Filled 2018-10-29: qty 1

## 2018-10-29 MED ORDER — CEPHALEXIN 500 MG PO CAPS: 500.0000 mg | ORAL_CAPSULE | Freq: Three times a day (TID) | ORAL | 0 refills | Status: DC

## 2018-10-29 NOTE — ED Triage Notes (Signed)
Patient here from home with complaints of vaginal discharge and itching x3 days. Denies pregnancy.

## 2018-10-30 NOTE — ED Provider Notes (Signed)
[facility]-EMERGENCY DEPT Provider Note   CSN: [id number] Arrival date & time: 10/29/18  1052    History   Chief Complaint Chief Complaint  Patient presents with  . Vaginal Discharge  . Vaginal Itching    HPI [name] [name] is a 28 y.o. female.     HPI 28 year old female presents to the emergency department with complaints of dysuria and urinary frequency over the past 2 to 3 days with new vaginal irritation and vaginal itching.  No normal vaginal bleeding described.  Denies nausea vomiting.  No abdominal discomfort.  No fevers.  No new sexual contacts.  Symptoms are mild in severity   History reviewed. No pertinent past medical history.  Patient Active Problem List   Diagnosis Date Noted  . Normal spontaneous vaginal delivery 06/21/2016    Past Surgical History:  Procedure Laterality Date  . DILATION AND CURETTAGE OF UTERUS  2009   ?for something similar to a cyst per patient.     OB History    Gravida  4   Para  1   Term  1   Preterm  0   AB  2   Living  1     SAB  1   TAB  1   Ectopic  0   Multiple      Live Births  1            Home Medications    Prior to Admission medications   Medication Sig Start Date End Date Taking? Authorizing Provider  ibuprofen (ADVIL,MOTRIN) 200 MG tablet Take 200 mg by mouth every 6 (six) hours as needed for headache, moderate pain or cramping.   Yes [provider]  cephALEXin (KEFLEX) 500 MG capsule Take 1 capsule (500 mg total) by mouth 3 (three) times daily. 10/29/18   Azalia Bilis, MD  metroNIDAZOLE (FLAGYL) 500 MG tablet 1 tab by mouth twice daily with meal for 7 days. Patient not taking: Reported on 10/29/2018 02/02/18   Julieanne Manson, MD    Family History Family History  Problem Relation Age of Onset  . Heart disease Father        rhythm issues    Social History Social History   Tobacco Use  . Smoking status: Never Smoker  . Smokeless tobacco:  Never Used  Substance Use Topics  . Alcohol use: Yes    Comment: sometimes  . Drug use: No     Allergies   Patient has no known allergies.   Review of Systems Review of Systems  All other systems reviewed and are negative.    Physical Exam Updated Vital Signs BP 100/64   Pulse 64   Temp 97.9 F (36.6 C) (Oral)   Resp 18   SpO2 100%   Physical Exam Vitals signs and nursing note reviewed.  Constitutional:      General: She is not in acute distress.    Appearance: She is well-developed.  HENT:     Head: Normocephalic and atraumatic.  Neck:     Musculoskeletal: Normal range of motion.  Cardiovascular:     Rate and Rhythm: Normal rate and regular rhythm.     Heart sounds: Normal heart sounds.  Pulmonary:     Effort: Pulmonary effort is normal.     Breath sounds: Normal breath sounds.  Chest:     Comments: Normal appearance of left breast.  No nipple discharge.  No masses palpated.  Chaperone present for examination Abdominal:  General: There is no distension.     Palpations: Abdomen is soft.     Tenderness: There is no abdominal tenderness.  Genitourinary:    Comments: Normal external genitalia.  No significant cervical erythema.  No cervical motion tenderness.  No significant vaginal discharge encountered.  No vaginal lesions noted.  Chaperone present Musculoskeletal: Normal range of motion.  Skin:    General: Skin is warm and dry.  Neurological:     Mental Status: She is alert and oriented to person, place, and time.  Psychiatric:        Judgment: Judgment normal.      ED Treatments / Results  Labs (all labs ordered are listed, but only abnormal results are displayed) Labs Reviewed  WET PREP, GENITAL - Abnormal; Notable for the following components:      Result Value   WBC, Wet Prep HPF POC MANY (*)    All other components within normal limits  URINALYSIS, ROUTINE W REFLEX MICROSCOPIC - Abnormal; Notable for the following components:   Hgb urine  dipstick MODERATE (*)    Leukocytes,Ua SMALL (*)    All other components within normal limits  I-STAT BETA HCG BLOOD, ED ([facility], [facility], [facility] ONLY)  GC/CHLAMYDIA PROBE AMP ([place]) NOT AT [facility]    EKG None  Radiology No results found.  Procedures Procedures (including critical care time)  Medications Ordered in ED Medications  cephALEXin (KEFLEX) capsule 500 mg (500 mg Oral Given 10/29/18 1514)     Initial Impression / Assessment and Plan / ED Course  I have reviewed the triage vital signs and the nursing notes.  Pertinent labs & imaging results that were available during my care of the patient were reviewed by me and considered in my medical decision making (see chart for details).        Patient be covered for urinary tract infection given her dysuria.  No clear etiology found on pelvic exam or wet prep here in the emergency department.  No indication for empiric treatment for STD at this time.  Recommend gynecology follow-up.  Secondary and later complaint of left breast tenderness.  Palpated with out evidence of masses.  No nipple discharge.  Normal appearance of left breast.  Chaperone present.  Final Clinical Impressions(s) / ED Diagnoses   Final diagnoses:  Acute cystitis without hematuria    ED Discharge Orders         Ordered    cephALEXin (KEFLEX) 500 MG capsule  3 times daily     10/29/18 1504           Azalia Bilis, MD 10/30/18 [phone]

## 2018-10-31 LAB — GC/CHLAMYDIA PROBE AMP (~~LOC~~) NOT AT ARMC
Chlamydia: NEGATIVE
Neisseria Gonorrhea: NEGATIVE

## 2019-01-06 ENCOUNTER — Ambulatory Visit: Payer: Self-pay | Admitting: Family Medicine

## 2019-01-09 ENCOUNTER — Ambulatory Visit: Payer: Self-pay | Admitting: Family Medicine

## 2021-03-19 ENCOUNTER — Ambulatory Visit: Payer: Self-pay | Admitting: Nurse Practitioner

## 2021-03-24 ENCOUNTER — Inpatient Hospital Stay (HOSPITAL_COMMUNITY)
Admission: AD | Admit: 2021-03-24 | Discharge: 2021-03-24 | Disposition: A | Discharge status: Home / Self Care | Payer: Self-pay | Attending: Family Medicine | Admitting: Family Medicine

## 2021-03-24 ENCOUNTER — Encounter (HOSPITAL_COMMUNITY): Payer: Self-pay | Admitting: Family Medicine

## 2021-03-24 ENCOUNTER — Other Ambulatory Visit: Payer: Self-pay

## 2021-03-24 ENCOUNTER — Inpatient Hospital Stay (HOSPITAL_COMMUNITY): Payer: Self-pay

## 2021-03-24 DIAGNOSIS — O039 Complete or unspecified spontaneous abortion without complication: Secondary | ICD-10-CM | POA: Insufficient documentation

## 2021-03-24 DIAGNOSIS — O209 Hemorrhage in early pregnancy, unspecified: Secondary | ICD-10-CM

## 2021-03-24 DIAGNOSIS — O26899 Other specified pregnancy related conditions, unspecified trimester: Secondary | ICD-10-CM

## 2021-03-24 LAB — URINALYSIS, ROUTINE W REFLEX MICROSCOPIC
Bacteria, UA: NONE SEEN
Bilirubin Urine: NEGATIVE
Glucose, UA: NEGATIVE mg/dL
Ketones, ur: NEGATIVE mg/dL
Nitrite: NEGATIVE
Protein, ur: NEGATIVE mg/dL
Specific Gravity, Urine: 1.018 (ref 1.005–1.030)
pH: 6 (ref 5.0–8.0)

## 2021-03-24 LAB — CBC
HCT: 36.8 % (ref 36.0–46.0)
Hemoglobin: 12.7 g/dL (ref 12.0–15.0)
MCH: 32.4 pg (ref 26.0–34.0)
MCHC: 34.5 g/dL (ref 30.0–36.0)
MCV: 93.9 fL (ref 80.0–100.0)
Platelets: 267 10*3/uL (ref 150–400)
RBC: 3.92 MIL/uL (ref 3.87–5.11)
RDW: 12.6 % (ref 11.5–15.5)
WBC: 6.9 10*3/uL (ref 4.0–10.5)
nRBC: 0 % (ref 0.0–0.2)

## 2021-03-24 LAB — POCT PREGNANCY, URINE: Preg Test, Ur: POSITIVE — AB

## 2021-03-24 LAB — HCG, QUANTITATIVE, PREGNANCY: hCG, Beta Chain, Quant, S: 2954 m[IU]/mL — ABNORMAL HIGH (ref <5)

## 2021-03-24 NOTE — MAU Provider Note (Signed)
Chief Complaint:  Spotting and Abdominal Pain   None    HPI: [name] [name] is a 30 y.o. G5P1021 at [redacted]w[redacted]d who presents to maternity admissions reporting vaginal spotting and lower abdominal cramping. Patient reports 3 days ago she began having some mild cramping and spotting. She has not tried anything for pain. Bleeding has only been when she wipes. She is not wearing a pad. Reports she passed a few "tiny" clots yesterday. She denies itching/odor/irritation or fever. Denies heavy vaginal bleeding.    Pregnancy Course:   No past medical history on file. OB History  Gravida Para Term Preterm AB Living  5 1 1  0 2 1  SAB IAB Ectopic Multiple Live Births  1 1 0   1    # Outcome Date GA Lbr Len/2nd Weight Sex Delivery Anes PTL Lv  5 Current           4 Term 11/12/12 [redacted]w[redacted]d 09:55 / 00:47 3065 g F Vag-Spont EPI  LIV     Birth Comments: None  3 SAB 2009 [redacted]w[redacted]d         2 IAB           1 Gravida            Past Surgical History:  Procedure Laterality Date   DILATION AND CURETTAGE OF UTERUS  2009   ?for something similar to a cyst per patient.   Family History  Problem Relation Age of Onset   Heart disease Father        rhythm issues   Social History   Tobacco Use   Smoking status: Never   Smokeless tobacco: Never  Vaping Use   Vaping Use: Never used  Substance Use Topics   Alcohol use: Yes    Comment: sometimes   Drug use: No   No Known Allergies Medications Prior to Admission  Medication Sig Dispense Refill Last Dose   cephALEXin (KEFLEX) 500 MG capsule Take 1 capsule (500 mg total) by mouth 3 (three) times daily. 21 capsule 0    ibuprofen (ADVIL,MOTRIN) 200 MG tablet Take 200 mg by mouth every 6 (six) hours as needed for headache, moderate pain or cramping.      metroNIDAZOLE (FLAGYL) 500 MG tablet 1 tab by mouth twice daily with meal for 7 days. (Patient not taking: Reported on 10/29/2018) 14 tablet 0     I have reviewed patient's Past Medical Hx, Surgical Hx, Family  Hx, Social Hx, medications and allergies.   ROS:  Review of Systems  Constitutional: Negative.   Respiratory: Negative.    Cardiovascular: Negative.   Gastrointestinal:  Positive for abdominal pain (cramping).  Genitourinary:  Positive for vaginal bleeding (spotting). Negative for dysuria and flank pain.  Musculoskeletal: Negative.   Neurological: Negative.   Psychiatric/Behavioral: Negative.     Physical Exam  Patient Vitals for the past 24 hrs:  BP Temp Temp src Pulse Resp SpO2 Height Weight  03/24/21 1439 (!) 105/54 98.1 F (36.7 C) Oral 70 20 99 % 4' 11.84" (1.52 m) 50.3 kg   Constitutional: well-developed, well-nourished female in no acute distress.  Cardiovascular: normal rate Respiratory: normal effort GI: abd soft, non-tender MS: extremities nontender, no edema, normal ROM Neurologic: alert and oriented x 4.      Labs: Results for orders placed or performed during the hospital encounter of 03/24/21 (from the past 24 hour(s))  Pregnancy, urine POC     Status: Abnormal   Collection Time: 03/24/21  2:35 PM  Result  Value Ref Range   Preg Test, Ur POSITIVE (A) NEGATIVE  Urinalysis, Routine w reflex microscopic Urine, Clean Catch     Status: Abnormal   Collection Time: 03/24/21  2:48 PM  Result Value Ref Range   Color, Urine YELLOW YELLOW   APPearance HAZY (A) CLEAR   Specific Gravity, Urine 1.018 1.005 - 1.030   pH 6.0 5.0 - 8.0   Glucose, UA NEGATIVE NEGATIVE mg/dL   Hgb urine dipstick LARGE (A) NEGATIVE   Bilirubin Urine NEGATIVE NEGATIVE   Ketones, ur NEGATIVE NEGATIVE mg/dL   Protein, ur NEGATIVE NEGATIVE mg/dL   Nitrite NEGATIVE NEGATIVE   Leukocytes,Ua SMALL (A) NEGATIVE   RBC / HPF 0-5 0 - 5 RBC/hpf   WBC, UA 11-20 0 - 5 WBC/hpf   Bacteria, UA NONE SEEN NONE SEEN   Squamous Epithelial / LPF 6-10 0 - 5  CBC     Status: None   Collection Time: 03/24/21  3:09 PM  Result Value Ref Range   WBC 6.9 4.0 - 10.5 K/uL   RBC 3.92 3.87 - 5.11 MIL/uL   Hemoglobin  12.7 12.0 - 15.0 g/dL   HCT 76.1 95.0 - 93.2 %   MCV 93.9 80.0 - 100.0 fL   MCH 32.4 26.0 - 34.0 pg   MCHC 34.5 30.0 - 36.0 g/dL   RDW 67.1 24.5 - 80.9 %   Platelets 267 150 - 400 K/uL   nRBC 0.0 0.0 - 0.2 %  hCG, quantitative, pregnancy     Status: Abnormal   Collection Time: 03/24/21  3:09 PM  Result Value Ref Range   hCG, Beta Chain, Quant, S 2,954 (H) <5 mIU/mL    Imaging:  US OB Comp Less 14 Wks  Result Date: 03/24/2021 CLINICAL DATA:  Vaginal bleeding.  Cramping. EXAM: OBSTETRIC <14 WK ULTRASOUND TECHNIQUE: Transabdominal ultrasound was performed for evaluation of the gestation as well as the maternal uterus and adnexal regions. COMPARISON:  None. FINDINGS: Intrauterine gestational sac: Single Yolk sac:  Not Visualized. Embryo:  Visualized. Cardiac Activity: Not Visualized. Heart Rate: 0 bpm CRL:   15.4 mm   7 w 6 d Subchorionic hemorrhage:  None visualized. Maternal uterus/adnexae: Unremarkable. No free fluid within the pelvis. IMPRESSION: Intrauterine gestational sac containing a 7 week 6 day embryo with no detectable cardiac activity. Findings meet definitive criteria for failed pregnancy. This follows SRU consensus guidelines: Diagnostic Criteria for Nonviable Pregnancy Early in the First Trimester. Macy Mis J Med [phone]. Electronically Signed   By: Duanne Guess D.O.   On: 03/24/2021 18:26     MAU Course: Orders Placed This Encounter  Procedures   US OB Comp Less 14 Wks   Urinalysis, Routine w reflex microscopic Urine, Clean Catch   CBC   hCG, quantitative, pregnancy   Pregnancy, urine POC   Discharge patient   No orders of the defined types were placed in this encounter.   MDM: CBC unremarkable [place] with results as above. Readings consistent with failed pregnancy. I discussed options regarding management with patient, including expectant management, medication management, and surgical management.  Patient chooses expectant management. We discussed bleeding  precautions and reviewed warning signs/symptoms and when to return to MAU for emergency care. Motrin/Tylenol for pain as needed. Patient verbalizes understanding. Spanish interpretor used during entire visit Will send message to [facility] for follow up in 1 week   Assessment: 1. Miscarriage   2. Abdominal pain affecting pregnancy   3. Vaginal bleeding affecting early pregnancy  Plan: Discharge home in stable condition Follow up at [facility] in 1 week Return to MAU as needed for emergencies     Follow-up Information     Center for [facility] Follow up.   Specialty: Obstetrics and Gynecology Why: someone will call you Contact information: [phone] [place] [address] [phone]                Allergies as of 03/24/2021   No Known Allergies      Medication List     STOP taking these medications    cephALEXin 500 MG capsule Commonly known as: KEFLEX   metroNIDAZOLE 500 MG tablet Commonly known as: FLAGYL       TAKE these medications    ibuprofen 200 MG tablet Commonly known as: ADVIL Take 200 mg by mouth every 6 (six) hours as needed for headache, moderate pain or cramping.         Camelia Eng, MSN, CNM 03/24/2021 8:09 PM

## 2021-03-24 NOTE — MAU Note (Addendum)
Presents with c/o abdominal pain and spotting.  Reports spotting began 3 days ago and at times it's bright red and others it's dark brown.  +HPT.  LMP 12/31/2020. Pregnancy verification letter from [facility] reviewed by this RN.

## 2021-04-07 ENCOUNTER — Ambulatory Visit: Payer: Self-pay | Admitting: Family Medicine

## 2021-04-08 ENCOUNTER — Other Ambulatory Visit: Payer: Self-pay

## 2021-04-08 ENCOUNTER — Encounter: Payer: Self-pay | Admitting: Family Medicine

## 2021-04-08 ENCOUNTER — Ambulatory Visit (INDEPENDENT_AMBULATORY_CARE_PROVIDER_SITE_OTHER): Payer: Self-pay | Admitting: Family Medicine

## 2021-04-08 VITALS — BP 105/68 | HR 76 | Wt 111.3 lb

## 2021-04-08 DIAGNOSIS — Z5189 Encounter for other specified aftercare: Secondary | ICD-10-CM

## 2021-04-08 DIAGNOSIS — O039 Complete or unspecified spontaneous abortion without complication: Secondary | ICD-10-CM

## 2021-04-08 NOTE — Progress Notes (Signed)
   GYNECOLOGY OFFICE VISIT NOTE  History:  30 y.o. F5D3220 here today for F/up SAB(on 03/24/2021). She denies any abnormal vaginal discharge or pelvic pain.  - Had heaving clots after MAU visit - 7 days later passed big clot with tissue - Reports still having some spotting.  -Using one panty liner per day and uses pad at night that only has a few spots.  - Denies nausea, vomiting, breast tenderness.  The following portions of the patient's history were reviewed and updated as appropriate: allergies, current medications, past family history, past medical history, past social history, past surgical history and problem list.   Health Maintenance:  Got PAP at [facility], will get records   Review of Systems:  Pertinent items noted in HPI Review of Systems  Constitutional:  Negative for chills and fever.  Cardiovascular:  Negative for chest pain.  Gastrointestinal:  Negative for abdominal pain, constipation and diarrhea.  Genitourinary:  Negative for dysuria.  Skin:  Negative for rash.   Objective:  Physical Exam BP 105/68   Pulse 76   Wt 111 lb 4.8 oz (50.5 kg)   LMP 12/28/2020   Breastfeeding Unknown   BMI 21.85 kg/m  Physical Exam Constitutional:      Appearance: Normal appearance.  Cardiovascular:     Rate and Rhythm: Normal rate and regular rhythm.     Pulses: Normal pulses.  Pulmonary:     Effort: Pulmonary effort is normal.  Abdominal:     General: Abdomen is flat. There is no distension.     Tenderness: There is no abdominal tenderness.  Neurological:     Mental Status: She is alert.    Labs and Imaging US OB Comp Less 14 Wks  Result Date: 03/24/2021 CLINICAL DATA:  Vaginal bleeding.  Cramping. EXAM: OBSTETRIC <14 WK ULTRASOUND TECHNIQUE: Transabdominal ultrasound was performed for evaluation of the gestation as well as the maternal uterus and adnexal regions. COMPARISON:  None. FINDINGS: Intrauterine gestational sac: Single Yolk sac:  Not Visualized. Embryo:   Visualized. Cardiac Activity: Not Visualized. Heart Rate: 0 bpm CRL:   15.4 mm   7 w 6 d Subchorionic hemorrhage:  None visualized. Maternal uterus/adnexae: Unremarkable. No free fluid within the pelvis. IMPRESSION: Intrauterine gestational sac containing a 7 week 6 day embryo with no detectable cardiac activity. Findings meet definitive criteria for failed pregnancy. This follows SRU consensus guidelines: Diagnostic Criteria for Nonviable Pregnancy Early in the First Trimester. Macy Mis J Med [phone]. Electronically Signed   By: Duanne Guess D.O.   On: 03/24/2021 18:26    Assessment & Plan:  Follow up for spontaneous abortion Patient has passed clots and tissue and now only having spotting. [place] at MAU showed embryo without cardiac activity ~[redacted]w[redacted]d gestation. Given no longer having pregnancy symptoms and bleeding slowed down likely completed passage of tissue. At this time confirmatory repeat quant and/or follow up US not required. Offered quant. - Patient without insurance so opting to defer quant - provided call back precautions  Total face-to-face time with patient: 30 minutes.  Over 50% of encounter was spent on counseling and coordination of care.  Warner Mccreedy, MD, MPH OB Fellow, Faculty [facility] for [facility], [facility]

## 2021-05-23 ENCOUNTER — Ambulatory Visit: Payer: Self-pay | Admitting: Nurse Practitioner

## 2021-06-04 ENCOUNTER — Telehealth: Payer: Self-pay

## 2021-06-04 NOTE — Telephone Encounter (Signed)
I return Pt call, explain the Pt what she need to do for applying for the financial program

## 2021-06-04 NOTE — Telephone Encounter (Signed)
Copied from CRM [id number]. Topic: General - Other >> Jun 04, 2021  2:30 PM Wyonia Hough E wrote: Reason for CRM: Pt called to speak to [facility] about financial aid/ please advise

## 2021-06-18 ENCOUNTER — Telehealth: Payer: Self-pay | Admitting: *Deleted

## 2021-06-18 NOTE — Telephone Encounter (Signed)
Copied from CRM [id number]. Topic: General - Other >> Jun 17, 2021  3:48 PM Gwenlyn Fudge wrote: Reason for CRM: Pt called and is requesting to speak with Mikle Bosworth in regards to financial info. Please advise.

## 2021-06-19 ENCOUNTER — Telehealth: Payer: Self-pay | Admitting: Internal Medicine

## 2021-06-19 NOTE — Telephone Encounter (Signed)
I return Pt call, no answer, she need to be an establish Pt with the clinic to apply for the financial programs

## 2021-09-07 NOTE — L&D Delivery Note (Signed)
OB/GYN Faculty Practice Delivery Note  [name] [name] is a 31 y.o. P5T6144 s/p SVD at [redacted]w[redacted]d. She was admitted for SOL.   ROM: 0h 18m with clear fluid GBS Status:  Positive/-- (07/31 0000) treated with PCN  Maximum Maternal Temperature:  Temp (24hrs), Avg:97.9 F (36.6 C), Min:97.8 F (36.6 C), Max:98.1 F (36.7 C)    Labor Progress: Patient arrived at 6 cm dilation and was induced with AROM.   Delivery Date/Time: 05/02/2022 at [phone] Delivery: Called to patients room but was infant delivered by Nursing staff prior to arrival. Dr. Salvadore Dom was present for the delivery but gloving when the baby was delivered. Head delivered in R position. No nuchal cord present. Shoulder and body delivered in usual fashion. Infant with spontaneous cry, placed on mother's abdomen, dried and stimulated. Cord clamped x 2 after 1-minute delay, and cut by FOB. Cord blood drawn. Placenta delivered spontaneously with gentle cord traction. Fundus firm with massage and Pitocin. Labia, perineum, vagina, and cervix inspected with small hemostatic 1st degree perineal laceration which did not require repair.   Placenta: spontaneous, intact, 3 vessel cord  Complications: None Lacerations: hemostatic 1st degree perineal laceration  EBL: 79 Analgesia: none   Infant: APGAR (1 MIN): 9   APGAR (5 MINS): 9   APGAR (10 MINS):    Weight: 3062 g  [name], MD  OB Fellow  05/02/2022 11:17 AM

## 2021-09-14 ENCOUNTER — Encounter (HOSPITAL_COMMUNITY): Payer: Self-pay | Admitting: Obstetrics and Gynecology

## 2021-09-14 ENCOUNTER — Inpatient Hospital Stay (HOSPITAL_COMMUNITY)
Admission: AD | Admit: 2021-09-14 | Discharge: 2021-09-14 | Disposition: A | Discharge status: Home / Self Care | Payer: Self-pay | Attending: Obstetrics and Gynecology | Admitting: Obstetrics and Gynecology

## 2021-09-14 ENCOUNTER — Other Ambulatory Visit: Payer: Self-pay

## 2021-09-14 ENCOUNTER — Inpatient Hospital Stay (HOSPITAL_COMMUNITY): Payer: Self-pay

## 2021-09-14 DIAGNOSIS — O468X1 Other antepartum hemorrhage, first trimester: Secondary | ICD-10-CM

## 2021-09-14 DIAGNOSIS — Z3A01 Less than 8 weeks gestation of pregnancy: Secondary | ICD-10-CM | POA: Insufficient documentation

## 2021-09-14 DIAGNOSIS — O418X10 Other specified disorders of amniotic fluid and membranes, first trimester, not applicable or unspecified: Secondary | ICD-10-CM | POA: Insufficient documentation

## 2021-09-14 DIAGNOSIS — R109 Unspecified abdominal pain: Secondary | ICD-10-CM | POA: Insufficient documentation

## 2021-09-14 DIAGNOSIS — M545 Low back pain, unspecified: Secondary | ICD-10-CM | POA: Insufficient documentation

## 2021-09-14 DIAGNOSIS — R102 Pelvic and perineal pain: Secondary | ICD-10-CM | POA: Insufficient documentation

## 2021-09-14 DIAGNOSIS — Z87440 Personal history of urinary (tract) infections: Secondary | ICD-10-CM | POA: Insufficient documentation

## 2021-09-14 DIAGNOSIS — O26891 Other specified pregnancy related conditions, first trimester: Secondary | ICD-10-CM | POA: Insufficient documentation

## 2021-09-14 DIAGNOSIS — Z3491 Encounter for supervision of normal pregnancy, unspecified, first trimester: Secondary | ICD-10-CM

## 2021-09-14 DIAGNOSIS — O208 Other hemorrhage in early pregnancy: Secondary | ICD-10-CM | POA: Insufficient documentation

## 2021-09-14 HISTORY — DX: Hyperlipidemia, unspecified: E78.5

## 2021-09-14 LAB — WET PREP, GENITAL
Clue Cells Wet Prep HPF POC: NONE SEEN
Sperm: NONE SEEN
Trich, Wet Prep: NONE SEEN
WBC, Wet Prep HPF POC: >=10 — AB (ref <10)
Yeast Wet Prep HPF POC: NONE SEEN

## 2021-09-14 LAB — URINALYSIS, ROUTINE W REFLEX MICROSCOPIC
Bilirubin Urine: NEGATIVE
Glucose, UA: NEGATIVE mg/dL
Ketones, ur: NEGATIVE mg/dL
Nitrite: NEGATIVE
Protein, ur: NEGATIVE mg/dL
Specific Gravity, Urine: 1.015 (ref 1.005–1.030)
pH: 6.5 (ref 5.0–8.0)

## 2021-09-14 LAB — POCT PREGNANCY, URINE: Preg Test, Ur: POSITIVE — AB

## 2021-09-14 LAB — CBC
HCT: 35.3 % — ABNORMAL LOW (ref 36.0–46.0)
Hemoglobin: 12 g/dL (ref 12.0–15.0)
MCH: 31.1 pg (ref 26.0–34.0)
MCHC: 34 g/dL (ref 30.0–36.0)
MCV: 91.5 fL (ref 80.0–100.0)
Platelets: 335 10*3/uL (ref 150–400)
RBC: 3.86 MIL/uL — ABNORMAL LOW (ref 3.87–5.11)
RDW: 13.5 % (ref 11.5–15.5)
WBC: 7.5 10*3/uL (ref 4.0–10.5)
nRBC: 0 % (ref 0.0–0.2)

## 2021-09-14 LAB — URINALYSIS, MICROSCOPIC (REFLEX)

## 2021-09-14 LAB — LIPASE, BLOOD: Lipase: 30 U/L (ref 11–51)

## 2021-09-14 LAB — HCG, QUANTITATIVE, PREGNANCY: hCG, Beta Chain, Quant, S: 68782 m[IU]/mL — ABNORMAL HIGH (ref <5)

## 2021-09-14 NOTE — Discharge Instructions (Signed)
Center for [place] for [place] @ [place] [facility]  [place] [phone]  Center for [facility] @ [place]  [phone]  [place] @ [facility]       [address] [phone]            Center for [place] @ [place]     [phone]          Center for [place] @ [place] [phone]  Center for [place] @ [place] [phone]     Center for [place] @ [place] Linna Hoff)  [place]   [phone]     [place] Department  Phone: [phone]

## 2021-09-14 NOTE — MAU Provider Note (Signed)
Chief Complaint: Abdominal Pain   None     SUBJECTIVE HPI: [name] [name] is a 31 y.o. [id number] at Unknown by unsure LMP who presents to maternity admissions reporting abdominal pain x 3 days. Positive pregnancy test 3 days ago. She has irregular menses and is unsure if LMP was in November or December.  She has hx of recurrent UTI.  The pain is cramping intermittent pain in her whole abdomen, from umbilicus area down into pelvis and into her low back.   She denies vaginal bleeding, vaginal itching/burning, urinary symptoms, h/a, dizziness, n/v, or fever/chills.     HPI  Past Medical History:  Diagnosis Date   Hyperlipemia    Past Surgical History:  Procedure Laterality Date   DILATION AND CURETTAGE OF UTERUS  2009   ?for something similar to a cyst per patient.   Social History   Socioeconomic History   Marital status: Media planner    Spouse name: Clinical biochemist   Number of children: 2   Years of education: Not on file   Highest education level: 12th grade  Occupational History   Occupation: Housekeeping-homes  Tobacco Use   Smoking status: Never   Smokeless tobacco: Never  Vaping Use   Vaping Use: Never used  Substance and Sexual Activity   Alcohol use: Yes    Comment: sometimes   Drug use: No   Sexual activity: Yes    Birth control/protection: None  Other Topics Concern   Not on file  Social History Narrative      Lives at home with her long term boyfriend and their 2 daughters.       Social Determinants of Health   Financial Resource Strain: Not on file  Food Insecurity: Food Insecurity Present   Worried About Running Out of Food in the Last Year: Sometimes true   Ran Out of Food in the Last Year: Sometimes true  Transportation Needs: No Transportation Needs   Lack of Transportation (Medical): No   Lack of Transportation (Non-Medical): No  Physical Activity: Not on file  Stress: Not on file  Social Connections: Not on file  Intimate Partner Violence:  Not on file   No current facility-administered medications on file prior to encounter.   No current outpatient medications on file prior to encounter.   No Known Allergies  ROS:  Review of Systems  Constitutional:  Negative for chills, fatigue and fever.  Respiratory:  Negative for shortness of breath.   Cardiovascular:  Negative for chest pain.  Gastrointestinal:  Positive for abdominal pain. Negative for constipation, nausea and vomiting.  Genitourinary:  Positive for pelvic pain. Negative for difficulty urinating, dysuria, flank pain, vaginal bleeding, vaginal discharge and vaginal pain.  Musculoskeletal:  Positive for back pain.  Neurological:  Negative for dizziness and headaches.  Psychiatric/Behavioral: Negative.      I have reviewed patient's Past Medical Hx, Surgical Hx, Family Hx, Social Hx, medications and allergies.   Physical Exam  Patient Vitals for the past 24 hrs:  BP Temp Pulse Resp SpO2 Height Weight  09/14/21 1712 107/61 98.6 F (37 C) 81 16 100 % -- --  09/14/21 1319 110/60 97.7 F (36.5 C) 71 16 100 % 4' 11.84" (1.52 m) 52.1 kg   Constitutional: Well-developed, well-nourished female in no acute distress.  Cardiovascular: normal rate Respiratory: normal effort GI: Abd soft, non-tender. No rebound tenderness or guarding. Pos BS x 4 MS: Extremities nontender, no edema, normal ROM Neurologic: Alert and oriented x 4.  GU: Neg  CVAT.  PELVIC EXAM: vaginal cultures collected by blind swab   LAB RESULTS Results for orders placed or performed during the hospital encounter of 09/14/21 (from the past 24 hour(s))  Pregnancy, urine POC     Status: Abnormal   Collection Time: 09/14/21  1:17 PM  Result Value Ref Range   Preg Test, Ur POSITIVE (A) NEGATIVE  Urinalysis, Routine w reflex microscopic Urine, Clean Catch     Status: Abnormal   Collection Time: 09/14/21  2:01 PM  Result Value Ref Range   Color, Urine YELLOW YELLOW   APPearance CLEAR CLEAR   Specific  Gravity, Urine 1.015 1.005 - 1.030   pH 6.5 5.0 - 8.0   Glucose, UA NEGATIVE NEGATIVE mg/dL   Hgb urine dipstick SMALL (A) NEGATIVE   Bilirubin Urine NEGATIVE NEGATIVE   Ketones, ur NEGATIVE NEGATIVE mg/dL   Protein, ur NEGATIVE NEGATIVE mg/dL   Nitrite NEGATIVE NEGATIVE   Leukocytes,Ua MODERATE (A) NEGATIVE  Urinalysis, Microscopic (reflex)     Status: Abnormal   Collection Time: 09/14/21  2:01 PM  Result Value Ref Range   RBC / HPF 0-5 0 - 5 RBC/hpf   WBC, UA 11-20 0 - 5 WBC/hpf   Bacteria, UA RARE (A) NONE SEEN   Squamous Epithelial / LPF 6-10 0 - 5   Mucus PRESENT   Wet prep, genital     Status: Abnormal   Collection Time: 09/14/21  2:09 PM  Result Value Ref Range   Yeast Wet Prep HPF POC NONE SEEN NONE SEEN   Trich, Wet Prep NONE SEEN NONE SEEN   Clue Cells Wet Prep HPF POC NONE SEEN NONE SEEN   WBC, Wet Prep HPF POC >=10 (A) <10   Sperm NONE SEEN   CBC     Status: Abnormal   Collection Time: 09/14/21  2:54 PM  Result Value Ref Range   WBC 7.5 4.0 - 10.5 K/uL   RBC 3.86 (L) 3.87 - 5.11 MIL/uL   Hemoglobin 12.0 12.0 - 15.0 g/dL   HCT 02.7 (L) 25.3 - 66.4 %   MCV 91.5 80.0 - 100.0 fL   MCH 31.1 26.0 - 34.0 pg   MCHC 34.0 30.0 - 36.0 g/dL   RDW 40.3 47.4 - 25.9 %   Platelets 335 150 - 400 K/uL   nRBC 0.0 0.0 - 0.2 %  hCG, quantitative, pregnancy     Status: Abnormal   Collection Time: 09/14/21  2:54 PM  Result Value Ref Range   hCG, Beta Chain, Quant, S 56,387 (H) <5 mIU/mL  Lipase, blood     Status: None   Collection Time: 09/14/21  2:54 PM  Result Value Ref Range   Lipase 30 11 - 51 U/L       IMAGING US OB LESS THAN 14 WEEKS WITH OB TRANSVAGINAL  Result Date: 09/14/2021 CLINICAL DATA:  Abdominal pain.  Unsure of LMP. EXAM: OBSTETRIC <14 WK [place] AND TRANSVAGINAL OB US TECHNIQUE: Both transabdominal and transvaginal ultrasound examinations were performed for complete evaluation of the gestation as well as the maternal uterus, adnexal regions, and pelvic cul-de-sac.  Transvaginal technique was performed to assess early pregnancy. COMPARISON:  CT abdomen pelvis and pelvic ultrasound dated June 30, 2015. FINDINGS: Intrauterine gestational sac: Single. Yolk sac:  Visualized. Embryo:  Visualized. Cardiac Activity: Visualized. Heart Rate: 161 bpm CRL:  13.7 mm   7 w   4 d  US EDC: 04/29/2022 Subchorionic hemorrhage: Small 8 x 12 x 17 mm subchorionic hemorrhage. Maternal uterus/adnexae: Unremarkable.  Right ovarian corpus luteum. IMPRESSION: 1. Single live intrauterine pregnancy with estimated gestational age of [redacted] weeks, 4 days. 2. Small subchorionic hemorrhage. Electronically Signed   By: Obie DredgeWilliam T Derry M.D.   On: 09/14/2021 15:04    MAU Management/MDM: Orders Placed This Encounter  Procedures   Wet prep, genital   Culture, OB Urine   US OB LESS THAN 14 WEEKS WITH OB TRANSVAGINAL   Urinalysis, Routine w reflex microscopic Urine, Clean Catch   CBC   hCG, quantitative, pregnancy   Lipase, blood   Urinalysis, Microscopic (reflex)   Pregnancy, urine POC   Discharge patient    No orders of the defined types were placed in this encounter.   Normal IUP on today's US with small subchorionic hemorrhage. Reviewed results with pt. UA with leukocytes, negative nitrites so sent for culture.  Rest/ice/heat/warm bath/increase PO fluids/Tylenol for pain. F/U with [facility] as planned for prenatal care.  Return to MAU as needed for emergencies.    ASSESSMENT 1. Normal IUP (intrauterine pregnancy) on prenatal ultrasound, first trimester   2. Abdominal pain during pregnancy in first trimester   3. Subchorionic hemorrhage of placenta in first trimester, single or unspecified fetus   4. [redacted] weeks gestation of pregnancy     PLAN Discharge home Allergies as of 09/14/2021   No Known Allergies      Medication List     STOP taking these medications    ibuprofen 200 MG tablet Commonly known as: ADVIL        Follow-up Information     Prenatal provider  of your choice Follow up.   Why: See list provided                Sharen CounterLisa Leftwich-Kirby Certified Nurse-Midwife 09/14/2021  5:43 PM

## 2021-09-14 NOTE — MAU Note (Signed)
Patient arrived to MAU complaining of abdominal pain that started yesterday. Patient stated that she took a HPT 3 days ago which was positive. She stated that her period is irregular and she does not remember if her LMP was 06/28/21 or 07/29/21.   Patient is also complaining of cold symptoms. She stated that she has had a headache and back pain for 1 week. Patient denies vaginal bleeding and or leakage of fluid.

## 2021-09-15 LAB — CULTURE, OB URINE: Culture: <10000 — AB

## 2021-09-15 LAB — GC/CHLAMYDIA PROBE AMP (~~LOC~~) NOT AT ARMC
Chlamydia: NEGATIVE
Comment: NEGATIVE
Comment: NORMAL
Neisseria Gonorrhea: NEGATIVE

## 2021-10-17 ENCOUNTER — Ambulatory Visit: Payer: Self-pay | Admitting: Nurse Practitioner

## 2021-10-27 LAB — OB RESULTS CONSOLE HIV ANTIBODY (ROUTINE TESTING): HIV: NONREACTIVE

## 2021-10-27 LAB — OB RESULTS CONSOLE RPR: RPR: NONREACTIVE

## 2021-10-27 LAB — OB RESULTS CONSOLE RUBELLA ANTIBODY, IGM: Rubella: IMMUNE

## 2021-10-27 LAB — OB RESULTS CONSOLE HEPATITIS B SURFACE ANTIGEN: Hepatitis B Surface Ag: NEGATIVE

## 2021-10-27 LAB — OB RESULTS CONSOLE GC/CHLAMYDIA
Chlamydia: NEGATIVE
Neisseria Gonorrhea: NEGATIVE

## 2021-10-27 LAB — HEPATITIS C ANTIBODY: HCV Ab: NEGATIVE

## 2021-10-27 LAB — OB RESULTS CONSOLE ABO/RH: RH Type: POSITIVE

## 2021-10-27 LAB — OB RESULTS CONSOLE ANTIBODY SCREEN: Antibody Screen: NEGATIVE

## 2022-04-06 LAB — OB RESULTS CONSOLE GBS: GBS: POSITIVE

## 2022-05-01 ENCOUNTER — Encounter (HOSPITAL_COMMUNITY): Payer: Self-pay | Admitting: *Deleted

## 2022-05-01 ENCOUNTER — Telehealth (HOSPITAL_COMMUNITY): Payer: Self-pay | Admitting: *Deleted

## 2022-05-01 ENCOUNTER — Inpatient Hospital Stay (HOSPITAL_COMMUNITY)
Admission: AD | Admit: 2022-05-01 | Discharge: 2022-05-03 | DRG: 807 | Disposition: A | Discharge status: Home / Self Care | Payer: Medicaid Other | Attending: Obstetrics & Gynecology | Admitting: Obstetrics & Gynecology

## 2022-05-01 DIAGNOSIS — O99824 Streptococcus B carrier state complicating childbirth: Secondary | ICD-10-CM | POA: Diagnosis present

## 2022-05-01 DIAGNOSIS — O480 Post-term pregnancy: Principal | ICD-10-CM | POA: Diagnosis present

## 2022-05-01 DIAGNOSIS — O9902 Anemia complicating childbirth: Secondary | ICD-10-CM | POA: Diagnosis present

## 2022-05-01 DIAGNOSIS — O99019 Anemia complicating pregnancy, unspecified trimester: Secondary | ICD-10-CM

## 2022-05-01 DIAGNOSIS — O99013 Anemia complicating pregnancy, third trimester: Secondary | ICD-10-CM

## 2022-05-01 DIAGNOSIS — D62 Acute posthemorrhagic anemia: Principal | ICD-10-CM

## 2022-05-01 DIAGNOSIS — Z3A40 40 weeks gestation of pregnancy: Secondary | ICD-10-CM

## 2022-05-01 NOTE — Telephone Encounter (Signed)
Preadmission screen  

## 2022-05-01 NOTE — Telephone Encounter (Signed)
Preadmission screen [phone] interpreter number

## 2022-05-02 ENCOUNTER — Other Ambulatory Visit: Payer: Self-pay

## 2022-05-02 ENCOUNTER — Encounter (HOSPITAL_COMMUNITY): Payer: Self-pay | Admitting: Family Medicine

## 2022-05-02 ENCOUNTER — Other Ambulatory Visit: Payer: Self-pay | Admitting: Advanced Practice Midwife

## 2022-05-02 DIAGNOSIS — O99824 Streptococcus B carrier state complicating childbirth: Secondary | ICD-10-CM | POA: Diagnosis present

## 2022-05-02 DIAGNOSIS — O99820 Streptococcus B carrier state complicating pregnancy: Secondary | ICD-10-CM | POA: Diagnosis not present

## 2022-05-02 DIAGNOSIS — O480 Post-term pregnancy: Secondary | ICD-10-CM | POA: Diagnosis present

## 2022-05-02 DIAGNOSIS — O9902 Anemia complicating childbirth: Secondary | ICD-10-CM | POA: Diagnosis present

## 2022-05-02 DIAGNOSIS — Z3A40 40 weeks gestation of pregnancy: Secondary | ICD-10-CM

## 2022-05-02 DIAGNOSIS — O99019 Anemia complicating pregnancy, unspecified trimester: Secondary | ICD-10-CM

## 2022-05-02 LAB — TYPE AND SCREEN
ABO/RH(D): A POS
Antibody Screen: NEGATIVE

## 2022-05-02 LAB — CBC
HCT: 30.3 % — ABNORMAL LOW (ref 36.0–46.0)
Hemoglobin: 10.3 g/dL — ABNORMAL LOW (ref 12.0–15.0)
MCH: 31.2 pg (ref 26.0–34.0)
MCHC: 34 g/dL (ref 30.0–36.0)
MCV: 91.8 fL (ref 80.0–100.0)
Platelets: 159 10*3/uL (ref 150–400)
RBC: 3.3 MIL/uL — ABNORMAL LOW (ref 3.87–5.11)
RDW: 14.6 % (ref 11.5–15.5)
WBC: 7.3 10*3/uL (ref 4.0–10.5)
nRBC: 0 % (ref 0.0–0.2)

## 2022-05-02 LAB — RPR: RPR Ser Ql: NONREACTIVE

## 2022-05-02 MED ORDER — BENZOCAINE-MENTHOL 20-0.5 % EX AERO
1.0000 | INHALATION_SPRAY | CUTANEOUS | Status: DC | PRN
  Administered 2022-05-02: 1 via TOPICAL
  Filled 2022-05-02: qty 56

## 2022-05-02 MED ORDER — EPHEDRINE 5 MG/ML INJ: 10.0000 mg | INTRAVENOUS | Status: DC | PRN

## 2022-05-02 MED ORDER — WITCH HAZEL-GLYCERIN EX PADS
1.0000 | MEDICATED_PAD | CUTANEOUS | Status: DC | PRN
Start: 2022-05-02 — End: 2022-05-04
  Administered 2022-05-03: 1 via TOPICAL

## 2022-05-02 MED ORDER — IBUPROFEN 600 MG PO TABS
600.0000 mg | ORAL_TABLET | Freq: Four times a day (QID) | ORAL | Status: DC
  Administered 2022-05-02 – 2022-05-03 (×5): 600 mg via ORAL
  Filled 2022-05-02 (×5): qty 1

## 2022-05-02 MED ORDER — OXYCODONE-ACETAMINOPHEN 5-325 MG PO TABS: 1.0000 | ORAL_TABLET | ORAL | Status: DC | PRN

## 2022-05-02 MED ORDER — FENTANYL CITRATE (PF) 100 MCG/2ML IJ SOLN
100.0000 ug | INTRAMUSCULAR | Status: DC | PRN
  Administered 2022-05-02: 25 ug via INTRAVENOUS
  Filled 2022-05-02: qty 2

## 2022-05-02 MED ORDER — ONDANSETRON HCL 4 MG PO TABS: 4.0000 mg | ORAL_TABLET | ORAL | Status: DC | PRN

## 2022-05-02 MED ORDER — SIMETHICONE 80 MG PO CHEW: 80.0000 mg | CHEWABLE_TABLET | ORAL | Status: DC | PRN

## 2022-05-02 MED ORDER — IBUPROFEN 600 MG PO TABS
ORAL_TABLET | ORAL | Status: AC
  Administered 2022-05-02: 600 mg
  Filled 2022-05-02: qty 1

## 2022-05-02 MED ORDER — SODIUM CHLORIDE 0.9 % IV SOLN
5.0000 10*6.[IU] | Freq: Once | INTRAVENOUS | Status: AC
  Administered 2022-05-02: 5 10*6.[IU] via INTRAVENOUS
  Filled 2022-05-02: qty 5

## 2022-05-02 MED ORDER — LACTATED RINGERS IV SOLN: 500.0000 mL | INTRAVENOUS | Status: DC | PRN

## 2022-05-02 MED ORDER — LACTATED RINGERS IV SOLN: INTRAVENOUS | Status: DC

## 2022-05-02 MED ORDER — SENNOSIDES-DOCUSATE SODIUM 8.6-50 MG PO TABS
2.0000 | ORAL_TABLET | Freq: Every day | ORAL | Status: DC
  Administered 2022-05-03: 2 via ORAL
  Filled 2022-05-02: qty 2

## 2022-05-02 MED ORDER — LACTATED RINGERS IV SOLN: 500.0000 mL | Freq: Once | INTRAVENOUS | Status: DC

## 2022-05-02 MED ORDER — COCONUT OIL OIL: 1.0000 | TOPICAL_OIL | Status: DC | PRN

## 2022-05-02 MED ORDER — TETANUS-DIPHTH-ACELL PERTUSSIS 5-2.5-18.5 LF-MCG/0.5 IM SUSY: 0.5000 mL | PREFILLED_SYRINGE | Freq: Once | INTRAMUSCULAR | Status: DC

## 2022-05-02 MED ORDER — OXYTOCIN BOLUS FROM INFUSION
333.0000 mL | Freq: Once | INTRAVENOUS | Status: AC
  Administered 2022-05-02: 333 mL via INTRAVENOUS

## 2022-05-02 MED ORDER — PHENYLEPHRINE 80 MCG/ML (10ML) SYRINGE FOR IV PUSH (FOR BLOOD PRESSURE SUPPORT)
80.0000 ug | PREFILLED_SYRINGE | INTRAVENOUS | Status: DC | PRN
Start: 2022-05-02 — End: 2022-05-02

## 2022-05-02 MED ORDER — ONDANSETRON HCL 4 MG/2ML IJ SOLN: 4.0000 mg | INTRAMUSCULAR | Status: DC | PRN

## 2022-05-02 MED ORDER — OXYCODONE-ACETAMINOPHEN 5-325 MG PO TABS: 2.0000 | ORAL_TABLET | ORAL | Status: DC | PRN

## 2022-05-02 MED ORDER — ONDANSETRON HCL 4 MG/2ML IJ SOLN: 4.0000 mg | Freq: Four times a day (QID) | INTRAMUSCULAR | Status: DC | PRN

## 2022-05-02 MED ORDER — IBUPROFEN 600 MG PO TABS: 600.0000 mg | ORAL_TABLET | Freq: Four times a day (QID) | ORAL | Status: DC

## 2022-05-02 MED ORDER — SOD CITRATE-CITRIC ACID 500-334 MG/5ML PO SOLN: 30.0000 mL | ORAL | Status: DC | PRN

## 2022-05-02 MED ORDER — FLEET ENEMA 7-19 GM/118ML RE ENEM: 1.0000 | ENEMA | RECTAL | Status: DC | PRN

## 2022-05-02 MED ORDER — PENICILLIN G POT IN DEXTROSE 60000 UNIT/ML IV SOLN
3.0000 10*6.[IU] | INTRAVENOUS | Status: DC
  Administered 2022-05-02: 3 10*6.[IU] via INTRAVENOUS
  Filled 2022-05-02: qty 50

## 2022-05-02 MED ORDER — FENTANYL-BUPIVACAINE-NACL 0.5-0.125-0.9 MG/250ML-% EP SOLN: 12.0000 mL/h | EPIDURAL | Status: DC | PRN

## 2022-05-02 MED ORDER — DIPHENHYDRAMINE HCL 25 MG PO CAPS: 25.0000 mg | ORAL_CAPSULE | Freq: Four times a day (QID) | ORAL | Status: DC | PRN

## 2022-05-02 MED ORDER — ACETAMINOPHEN 325 MG PO TABS: 650.0000 mg | ORAL_TABLET | ORAL | Status: DC | PRN

## 2022-05-02 MED ORDER — DIPHENHYDRAMINE HCL 50 MG/ML IJ SOLN: 12.5000 mg | INTRAMUSCULAR | Status: DC | PRN

## 2022-05-02 MED ORDER — OXYTOCIN-SODIUM CHLORIDE 30-0.9 UT/500ML-% IV SOLN
2.5000 [IU]/h | INTRAVENOUS | Status: DC
  Administered 2022-05-02: 2.5 [IU]/h via INTRAVENOUS
  Filled 2022-05-02: qty 500

## 2022-05-02 MED ORDER — LIDOCAINE HCL (PF) 1 % IJ SOLN: 30.0000 mL | INTRAMUSCULAR | Status: DC | PRN

## 2022-05-02 MED ORDER — PRENATAL MULTIVITAMIN CH
1.0000 | ORAL_TABLET | Freq: Every day | ORAL | Status: DC
  Administered 2022-05-02 – 2022-05-03 (×2): 1 via ORAL
  Filled 2022-05-02 (×2): qty 1

## 2022-05-02 MED ORDER — DIBUCAINE (PERIANAL) 1 % EX OINT
1.0000 | TOPICAL_OINTMENT | CUTANEOUS | Status: DC | PRN
  Administered 2022-05-03: 1 via RECTAL
  Filled 2022-05-02: qty 28

## 2022-05-02 NOTE — Discharge Summary (Signed)
Postpartum Discharge Summary  Date of Service updated***     Patient Name: [name] [name] DOB: 29-Dec-1990 MRN: [id number]  Date of admission: 05/01/2022 Delivery date:05/02/2022  Delivering provider: Gerlene Fee  Date of discharge: 05/02/2022  Admitting diagnosis: Normal labor and delivery [O80] [redacted] weeks gestation of pregnancy [Z3A.40] Intrauterine pregnancy: [redacted]w[redacted]d     Secondary diagnosis:  Principal Problem:   Normal labor and delivery Active Problems:   [redacted] weeks gestation of pregnancy   Anemia in pregnancy  Additional problems: ***    Discharge diagnosis: {DX.:23714}                                              Post partum procedures:{Postpartum procedures:23558} Augmentation: {IWLNLGXQJJHE:17408} Complications: {OB Labor/Delivery Complications:20784}  Hospital course: Onset of Labor With Vaginal Delivery      31 y.o. yo [id number] at [redacted]w[redacted]d was admitted in Active Labor on 05/01/2022. Patient had an uncomplicated labor course as follows:  Membrane Rupture Time/Date: 9:02 AM ,05/02/2022   Delivery Method:Vaginal, Spontaneous  Episiotomy: None  Lacerations:  None  Patient had an uncomplicated postpartum course.  She is ambulating, tolerating a regular diet, passing flatus, and urinating well. Patient is discharged home in stable condition on 05/02/22.  Newborn Data: Birth date:05/02/2022  Birth time:9:53 AM  Gender:Female  Living status:Living  Apgars:9 ,9  Weight:3062 g   Magnesium Sulfate received: No BMZ received: No Rhophylac:No MMR:{MMR:30440033} T-DaP:{Tdap:23962} Flu: {UDJ:49702} Transfusion:{Transfusion received:30440034}  Physical exam  Vitals:   05/02/22 1016 05/02/22 1018 05/02/22 1025 05/02/22 1057  BP: 106/89 106/89 110/88 112/82  Pulse:   68 70  Resp:      Temp:      TempSrc:      Weight:      Height:       General: {Exam; general:21111117} Lochia: {Desc; appropriate/inappropriate:30686::"appropriate"} Uterine Fundus: {Desc;  firm/soft:30687} Incision: {Exam; incision:21111123} DVT Evaluation: {Exam; dvt:2111122} Labs: Lab Results  Component Value Date   WBC 7.3 05/02/2022   HGB 10.3 (L) 05/02/2022   HCT 30.3 (L) 05/02/2022   MCV 91.8 05/02/2022   PLT 159 05/02/2022      Latest Ref Rng & Units 06/30/2015    2:11 AM  CMP  Glucose 65 - 99 mg/dL 110   BUN 6 - 20 mg/dL 16   Creatinine 0.44 - 1.00 mg/dL 0.57   Sodium 135 - 145 mmol/L 137   Potassium 3.5 - 5.1 mmol/L 4.1   Chloride 101 - 111 mmol/L 108   CO2 22 - 32 mmol/L 23   Calcium 8.9 - 10.3 mg/dL 9.0   Total Protein 6.5 - 8.1 g/dL 7.5   Total Bilirubin 0.3 - 1.2 mg/dL 0.3   Alkaline Phos 38 - 126 U/L 69   AST 15 - 41 U/L 94   ALT 14 - 54 U/L 63    Edinburgh Score:     No data to display           After visit meds:  Allergies as of 05/02/2022   No Known Allergies   Med Rec must be completed prior to using this [facility]***        Discharge home in stable condition Infant Feeding: {Baby feeding:23562} Infant Disposition:{CHL IP OB HOME WITH OVZCHY:85027} Discharge instruction: per After Visit Summary and Postpartum booklet. Activity: Advance as tolerated. Pelvic rest for 6 weeks.  Diet: {OB diet:21111121} Future  Appointments:No future appointments. Follow up Visit:   Please schedule this patient for a In person postpartum visit in 6 weeks with the following provider: Any provider. Additional Postpartum F/U: None   Low risk pregnancy complicated by:  None Delivery mode:  Vaginal, Spontaneous  Anticipated Birth Control:   Desires BTL but uninsured   05/02/2022 Concepcion Living, MD

## 2022-05-02 NOTE — Lactation Note (Signed)
This note was copied from a baby's chart. Lactation Consultation Note  Patient Name: [name] [name] Pol QASTM'H Date: 05/02/2022 Reason for consult: Initial assessment Age:33 hours  Interpreter used.   Parent desires to continue to try latching but also to begin pumping. She states baby's mouth is not big enough for her nipple and he's spitting it out.    Parent states previous children would vomit with BF but eventually did latch.   She pumped for 9 months.  Baby does not open wide.  When attempted to latch, baby tongue thrusts the nipple and sucks just the tip.    Suck training shown to parent.   Encouraged to attempt to latch with cues, encouraged offering 10-15 ml of formula with each feeding.  RN to set up pump for parent.       Maternal Data Has patient been taught Hand Expression?: Yes Does the patient have breastfeeding experience prior to this delivery?: Yes How long did the patient breastfeed?: pumping 9 months with both children  Feeding Mother's Current Feeding Choice: Breast Milk and Formula Nipple Type: Slow - flow  LATCH Score Latch: Too sleepy or reluctant, no latch achieved, no sucking elicited.  Audible Swallowing: None  Type of Nipple: Everted at rest and after stimulation  Comfort (Breast/Nipple): Soft / non-tender  Hold (Positioning): Assistance needed to correctly position infant at breast and maintain latch.  LATCH Score: 5   Lactation Tools Discussed/Used Reason for Pumping: RN to set up due to difficulty latching and mom's request  Interventions Interventions: Breast feeding basics reviewed;Assisted with latch;Education  Discharge    Consult Status Consult Status: Follow-up Date: 05/03/22    Maryruth Hancock [facility] 05/02/2022, 4:36 PM

## 2022-05-02 NOTE — Progress Notes (Signed)
Labor Progress Note [name] [name] is a 31 y.o. [phone] at [redacted]w[redacted]d presented for SOL  S: Pt feeling more pressure  O:  BP 112/71   Pulse 69   Temp 97.9 F (36.6 C) (Oral)   Resp 15   Ht 4\' 8"  (1.422 m)   Wt 58.7 kg   LMP  (Approximate)   BMI 29.01 kg/m  EFM: 135bpm/Moderate variability/ 15x15 accels/ None decels   CVE: Dilation: 7 Effacement (%): 70 Station: -1 Presentation: Vertex Exam by:: Dr. [email or web address]   A&P: 31 y.o. 34 [redacted]w[redacted]d here for SOL #Labor: Progressing well. Expectant management while GBS ppx running #Pain: none #FWB: CAT 1 #GBS positive-- PCN   Derisha Funderburke Q Mercado-Ortiz, DO 4:01 AM

## 2022-05-02 NOTE — MAU Note (Signed)
Pt says with UC- with interpreter- 1020pm [facility]- [address]  On Monday - VE - 2 cm Denies HSV GBS- positive

## 2022-05-02 NOTE — H&P (Signed)
OBSTETRIC ADMISSION HISTORY AND PHYSICAL  [name] [name] is a 31 y.o. female [phone] with IUP at [redacted]w[redacted]d by [place] presenting for SOL. She reports +FMs, No LOF, no VB, no blurry vision, headaches or peripheral edema, and RUQ pain.  She plans on breast feeding. She request BTL for birth control. She received her prenatal care at  [facility]    Dating: By [place] --->  Estimated Date of Delivery: 04/29/22  Sono:   @[redacted]w[redacted]d , CWD, normal anatomy, 16.8% EFW   Prenatal History/Complications:  Anemia in pregnancy Post Term pregnancy  Past Medical History: Past Medical History:  Diagnosis Date   Hyperlipemia     Past Surgical History: Past Surgical History:  Procedure Laterality Date   DILATION AND CURETTAGE OF UTERUS  2009   ?for something similar to a cyst per patient.    Obstetrical History: OB History     Gravida  6   Para  2   Term  2   Preterm  0   AB  2   Living  2      SAB  2   IAB  0   Ectopic  0   Multiple  0   Live Births  2           Social History Social History   Socioeconomic History   Marital status: 2010    Spouse name: Media planner   Number of children: 2   Years of education: Not on file   Highest education level: 12th grade  Occupational History   Occupation: Housekeeping-homes  Tobacco Use   Smoking status: Never   Smokeless tobacco: Never  Vaping Use   Vaping Use: Never used  Substance and Sexual Activity   Alcohol use: Yes    Comment: sometimes   Drug use: No   Sexual activity: Yes    Birth control/protection: None  Other Topics Concern   Not on file  Social History Narrative      Lives at home with her long term boyfriend and their 2 daughters.       Social Determinants of Health   Financial Resource Strain: Not on file  Food Insecurity: Food Insecurity Present (04/08/2021)   Hunger Vital Sign    Worried About Running Out of Food in the Last Year: Sometimes true    Ran Out of Food in the Last Year: Sometimes true   Transportation Needs: No Transportation Needs (04/08/2021)   PRAPARE - 06/08/2021 (Medical): No    Lack of Transportation (Non-Medical): No  Physical Activity: Not on file  Stress: Not on file  Social Connections: Not on file    Family History: Family History  Problem Relation Age of Onset   Heart disease Father        rhythm issues    Allergies: No Known Allergies  Medications Prior to Admission  Medication Sig Dispense Refill Last Dose   Prenatal Vit-Fe Fumarate-FA (PRENATAL MULTIVITAMIN) TABS tablet Take 1 tablet by mouth daily at 12 noon.        Review of Systems   All systems reviewed and negative except as stated in HPI  Blood pressure 107/68, pulse 79, temperature 97.8 F (36.6 C), temperature source Oral, resp. rate 20, height 4\' 8"  (1.422 m), weight 58.7 kg, unknown if currently breastfeeding. General appearance: alert, cooperative, and appears stated age Lungs: clear to auscultation bilaterally Heart: regular rate and rhythm Abdomen: soft, non-tender; bowel sounds normal Pelvic: SVE per MAU Extremities: Homans sign  is negative, no sign of DVT Presentation: cephalic Fetal monitoringBaseline: 125 bpm, Variability: Good {> 6 bpm), Accelerations: Reactive, and Decelerations: Absent Uterine activityFrequency: Every 10 minutes Dilation: 6 Effacement (%): 80 Station: -2 Exam by:: DCallaway, RN   Prenatal labs: ABO, Rh: --/--/A POS (08/26 4627) Antibody: NEG (08/26 0156) Rubella: Immune (02/20 0000) RPR: Nonreactive (02/20 0000)  HBsAg: Negative (02/20 0000)  HIV: Non-reactive (02/20 0000)  GBS: Positive/-- (07/31 0000)  1 hr Glucola 128 Genetic screening  nml Anatomy US done  Prenatal Transfer Tool  Maternal Diabetes: No Genetic Screening: Normal Maternal Ultrasounds/Referrals: Normal Fetal Ultrasounds or other Referrals:  None Maternal Substance Abuse:  No Significant Maternal Medications:  None Significant Maternal  Lab Results: Group B Strep positive  Results for orders placed or performed during the hospital encounter of 05/01/22 (from the past 24 hour(s))  CBC   Collection Time: 05/02/22  1:55 AM  Result Value Ref Range   WBC 7.3 4.0 - 10.5 K/uL   RBC 3.30 (L) 3.87 - 5.11 MIL/uL   Hemoglobin 10.3 (L) 12.0 - 15.0 g/dL   HCT 03.5 (L) 00.9 - 38.1 %   MCV 91.8 80.0 - 100.0 fL   MCH 31.2 26.0 - 34.0 pg   MCHC 34.0 30.0 - 36.0 g/dL   RDW 82.9 93.7 - 16.9 %   Platelets 159 150 - 400 K/uL   nRBC 0.0 0.0 - 0.2 %  Type and screen [facility]   Collection Time: 05/02/22  1:56 AM  Result Value Ref Range   ABO/RH(D) A POS    Antibody Screen NEG    Sample Expiration      05/05/2022,2359 Performed at [facility], [address] [place], [place] [address]     Patient Active Problem List   Diagnosis Date Noted   Normal labor and delivery 05/02/2022   [redacted] weeks gestation of pregnancy 05/02/2022   Anemia in pregnancy 05/02/2022   Normal spontaneous vaginal delivery 06/21/2016    Assessment/Plan:  [name] [name] is a 31 y.o. [id number] at [redacted]w[redacted]d here forSOL  #Labor: expectant management, pt making cervical change on her own #Pain: Epidural upon request #FWB: CAT 1 #ID:  GBS pos, PCN started #MOF: Breast #MOC:BTL desired, uninsured #Circ:  declined  Myrtie Hawk, DO  05/02/2022, 3:14 AM

## 2022-05-02 NOTE — Progress Notes (Addendum)
Labor Progress Note [name] [name] is a 31 y.o. [id number] at [redacted]w[redacted]d presented for SOL. S: Patient is resting comfortably. Discussed benefits and risks of breaking water.  O:  BP 113/78   Pulse 67   Temp 98.1 F (36.7 C) (Oral)   Resp 16   Ht 4\' 8"  (1.422 m)   Wt 58.7 kg   LMP  (Approximate)   BMI 29.01 kg/m  EFM: 135/moderate variability/accels present  CVE: Dilation: 6.5 Effacement (%): 100 Station: -1 Presentation: Vertex Exam by:: jv Harce Volden   A&P: 31 y.o. 34 [redacted]w[redacted]d here for SOL. #Labor: Progressing well. AROM. Expectant management. #Pain: none #FWB: Cat 1 #GBS positive s/p PCN  [redacted]w[redacted]d, MD, PGY-1 Center for [facility], [facility] 8:57 AM

## 2022-05-03 ENCOUNTER — Inpatient Hospital Stay (HOSPITAL_COMMUNITY): Payer: Self-pay

## 2022-05-03 ENCOUNTER — Inpatient Hospital Stay (HOSPITAL_COMMUNITY): Admission: AD | Admit: 2022-05-03 | Payer: Self-pay | Source: Home / Self Care | Admitting: Obstetrics and Gynecology

## 2022-05-03 LAB — CBC
HCT: 27.6 % — ABNORMAL LOW (ref 36.0–46.0)
Hemoglobin: 9.2 g/dL — ABNORMAL LOW (ref 12.0–15.0)
MCH: 30.9 pg (ref 26.0–34.0)
MCHC: 33.3 g/dL (ref 30.0–36.0)
MCV: 92.6 fL (ref 80.0–100.0)
Platelets: 155 10*3/uL (ref 150–400)
RBC: 2.98 MIL/uL — ABNORMAL LOW (ref 3.87–5.11)
RDW: 14.7 % (ref 11.5–15.5)
WBC: 8.2 10*3/uL (ref 4.0–10.5)
nRBC: 0 % (ref 0.0–0.2)

## 2022-05-03 LAB — WET PREP, GENITAL
Clue Cells Wet Prep HPF POC: NONE SEEN
Sperm: NONE SEEN
Trich, Wet Prep: NONE SEEN
WBC, Wet Prep HPF POC: <10 (ref <10)
Yeast Wet Prep HPF POC: NONE SEEN

## 2022-05-03 MED ORDER — IBUPROFEN 600 MG PO TABS
600.0000 mg | ORAL_TABLET | Freq: Four times a day (QID) | ORAL | 1 refills | Status: AC
Start: 2022-05-03 — End: ?

## 2022-05-03 MED ORDER — ACETAMINOPHEN 325 MG PO TABS: 650.0000 mg | ORAL_TABLET | ORAL | 1 refills | Status: AC | PRN

## 2022-05-03 MED ORDER — FERROUS SULFATE 325 (65 FE) MG PO TABS: 325.0000 mg | ORAL_TABLET | ORAL | 1 refills | Status: AC

## 2022-05-03 MED ORDER — FLUCONAZOLE 150 MG PO TABS
150.0000 mg | ORAL_TABLET | Freq: Once | ORAL | Status: AC
  Administered 2022-05-03: 150 mg via ORAL
  Filled 2022-05-03: qty 1

## 2022-05-03 MED ORDER — FLUCONAZOLE 150 MG PO TABS
150.0000 mg | ORAL_TABLET | Freq: Every day | ORAL | Status: DC
  Filled 2022-05-03: qty 1

## 2022-05-03 NOTE — Progress Notes (Addendum)
CSW received a consult that MOB is requesting Medicaid and financial assistance information. CSW met with MOB at bedside with in house [place] interpreter to provide assistance. When CSW entered room, MOB was observed sitting in hospital bed. FOB was sitting next to MOB bonding with infant. CSW introduced self and explained reason for consult. MOB reports she needs to apply for financial assistance for her hospital stay and inquired about the process. CSW offered to contact [facility] Financial Navigator on MOB's behalf to follow up with MOB regarding financial assistance/insurance needs. MOB provided verbal consent to do so. CSW also offered to send Medicaid application to MOB's phone for MOB to apply for Medicaid on behalf of infant. MOB received application and thanked CSW. No additional needs reported.   Signed,  Berniece Salines, MSW, LCSWA, LCASA 05/03/2022 2:56 PM

## 2022-05-03 NOTE — Lactation Note (Signed)
This note was copied from a baby's chart. Lactation Consultation Note  Patient Name: [name] [name] Date: 05/03/2022 Reason for consult: Mother's request;Term Age:33 hours  Interpreter, Alonna Minium [phone] and in-house interpreter, Raquel, used for consult. Mom said she did not recall telling 3rd-shift RN that she has a pump at home (see note from K. Barry Dienes, RN). WIC Loaner was provided and a referral was sent to [facility], also. Per R. Drue Second, RN, Mom said she had WIC. However during consult, it seemed that she may be in the process of applying for [facility].   Per interpreter, Mom is pumping q5 hr, but between that she is putting infant to the breast for 15 min each breast. Mom now knows to pump whenever infant receives formula.   Infant was not observed at breast during consult. Mom has compression stripes bilaterally (L nipple more severe than R).   Mom had been using size 24 flanges to pump. Even though Mom pumped recently, her L breast had some mild fullness. Mom was willing to pump again with size 27 flanges and was able to obtain another additional 10 mLs. Mom verbalized that the size 27 flange was more comfortable.   Feeding Mother's Current Feeding Choice: Breast Milk and Formula Nipple Type: Extra Slow Flow    Lactation Tools Discussed/Used Tools: Pump;Flanges Flange Size: 27 Breast pump type: Double-Electric Breast Pump Pumping frequency: pumping q5h Pumped volume: 10 mL  Interventions Interventions: Education;DEBP  Discharge Pump: [facility] Loaner  Consult Status Consult Status: Complete    Remigio Eisenmenger 05/03/2022, 6:00 PM

## 2022-05-03 NOTE — Progress Notes (Signed)
POSTPARTUM PROGRESS NOTE  Post Partum Day 1  Subjective:  [name] [name] is a 31 y.o. [id number] s/p SVD at [redacted]w[redacted]d.  She reports she is doing well. No acute events overnight. She denies any problems with ambulating, voiding or po intake. Denies nausea or vomiting.  Pain is well controlled.  Lochia is adequate.  Objective: Blood pressure 97/66, pulse (!) 59, temperature 98.1 F (36.7 C), temperature source Oral, resp. rate 18, height 4\' 8"  (1.422 m), weight 58.7 kg, SpO2 100 %, unknown if currently breastfeeding.  Physical Exam:  General: alert, cooperative and no distress Chest: no respiratory distress Heart:regular rate, distal pulses intact Uterine Fundus: firm, appropriately tender DVT Evaluation: No calf swelling or tenderness Extremities: no edema Skin: warm, dry  Recent Labs    05/02/22 0155 05/03/22 0453  HGB 10.3* 9.2*  HCT 30.3* 27.6*    Assessment/Plan: [name] [name] is a 31 y.o. 31 s/p SVD at [redacted]w[redacted]d   PPD#1 - Doing well  Routine postpartum care Contraception: desires BTL, but does not desire anything in the meantime Feeding: Breast, bottle Dispo: Plan for discharge tomorrow.   LOS: 1 day   [redacted]w[redacted]d, DO OB Fellow  05/03/2022, 8:32 AM

## 2022-05-03 NOTE — Progress Notes (Signed)
Genital wet prep completed. Pt tolerated well.

## 2022-05-09 ENCOUNTER — Telehealth (HOSPITAL_COMMUNITY): Payer: Self-pay

## 2022-05-09 ENCOUNTER — Inpatient Hospital Stay (HOSPITAL_COMMUNITY)
Admission: AD | Admit: 2022-05-09 | Discharge: 2022-05-10 | Disposition: A | Discharge status: Home / Self Care | Payer: Self-pay | Attending: Family Medicine | Admitting: Family Medicine

## 2022-05-09 DIAGNOSIS — N644 Mastodynia: Secondary | ICD-10-CM | POA: Insufficient documentation

## 2022-05-09 DIAGNOSIS — N6459 Other signs and symptoms in breast: Secondary | ICD-10-CM | POA: Insufficient documentation

## 2022-05-09 DIAGNOSIS — O9212 Cracked nipple associated with the puerperium: Secondary | ICD-10-CM

## 2022-05-09 DIAGNOSIS — O909 Complication of the puerperium, unspecified: Secondary | ICD-10-CM | POA: Insufficient documentation

## 2022-05-09 DIAGNOSIS — O9279 Other disorders of lactation: Secondary | ICD-10-CM

## 2022-05-09 MED ORDER — OXYCODONE HCL 5 MG PO TABS
5.0000 mg | ORAL_TABLET | Freq: Once | ORAL | Status: AC
  Administered 2022-05-09: 5 mg via ORAL
  Filled 2022-05-09: qty 1

## 2022-05-09 MED ORDER — ACETAMINOPHEN 500 MG PO TABS
1000.0000 mg | ORAL_TABLET | Freq: Once | ORAL | Status: AC
  Administered 2022-05-09: 1000 mg via ORAL
  Filled 2022-05-09: qty 2

## 2022-05-09 MED ORDER — DICLOXACILLIN SODIUM 500 MG PO CAPS: 500.0000 mg | ORAL_CAPSULE | Freq: Four times a day (QID) | ORAL | 1 refills | Status: AC

## 2022-05-09 MED ORDER — COCONUT OIL OIL: 1.0000 | TOPICAL_OIL | Status: DC | PRN

## 2022-05-09 MED ORDER — COCONUT OIL OIL: 1.0000 | TOPICAL_OIL | 3 refills | Status: AC | PRN

## 2022-05-09 NOTE — Discharge Instructions (Addendum)
Consulte algunas referencias anteriores para obtener ideas que le ayuden con el dolor y la congestin.  Est bien dejar de poner al beb en el pecho izquierdo [employer] prximos 2 o 3 das hasta que el pezn sane. Contine extrayendo leche de este seno aproximadamente cada 2 o 3 horas. Puede ayudar extraer [employer] con las manos primero cuando est muy lleno antes de usar la bomba. No extraiga leche durante ms de 10 a 15 minutos seguidos.  Use sostenes firmes que brinden soporte. Use compresas de hielo para el dolor, especialmente cuando no est extrayendo leche.  Aplique aceite de coco en ambos pezones despus de cada sesin de alimentacin o extraccin de [employer].  Tome el antibitico segn lo recetado durante los prximos 5 das; est bien suspenderlo si los sntomas han desaparecido. Si todava tiene dolor, Publishing copy y contine durante [address].  English version Please see some references above for ideas to help with pain and engorgement.  OK to hold off putting baby on left breast for the next 2 -3 days until nipple is healing. Please continue to pump out of this breast about every 2-3 hours. Can help to express with your hands first when very full before using the pump. Please don't pump for more than 10-15 mins at a stretch.  Wear firm supportive bras. Use ice packs for pain, especially when not pumping.  Apply coconut oil on both nipples after every feeding or pumping session.  Take antibiotic as prescribed for the next

## 2022-05-09 NOTE — MAU Provider Note (Signed)
History     [id number]  Arrival date and time: 05/09/22 2138    No chief complaint on file.    HPI [name] [name] is a 31 y.o. at [redacted]w[redacted]d by *** with PMHx notable for ***, who presents for ***.   Review of outside prenatal records from [facility] (in media tab): ***  Review of discharge summary from last admission on ***:   Review of records from Care Everywhere: ***  ***merge into HPI Vaginal bleeding: {yes/no:20286} LOF: {yes/no:20286} Fetal Movement: {yes/no:20286} Contractions: {yes/no:20286}  --/--/A POS (08/26 0156)  {GYN/OB WH:6759163}  Past Medical History:  Diagnosis Date   Hyperlipemia     Past Surgical History:  Procedure Laterality Date   DILATION AND CURETTAGE OF UTERUS  2009   ?for something similar to a cyst per patient.    Family History  Problem Relation Age of Onset   Heart disease Father        rhythm issues    Social History   Socioeconomic History   Marital status: Media planner    Spouse name: Clinical biochemist   Number of children: 2   Years of education: Not on file   Highest education level: 12th grade  Occupational History   Occupation: Housekeeping-homes  Tobacco Use   Smoking status: Never   Smokeless tobacco: Never  Vaping Use   Vaping Use: Never used  Substance and Sexual Activity   Alcohol use: Yes    Comment: sometimes   Drug use: No   Sexual activity: Yes    Birth control/protection: None  Other Topics Concern   Not on file  Social History Narrative      Lives at home with her long term boyfriend and their 2 daughters.       Social Determinants of Health   Financial Resource Strain: Not on file  Food Insecurity: Food Insecurity Present (04/08/2021)   Hunger Vital Sign    Worried About Running Out of Food in the Last Year: Sometimes true    Ran Out of Food in the Last Year: Sometimes true  Transportation Needs: No Transportation Needs (04/08/2021)   PRAPARE - Administrator, Civil Service  (Medical): No    Lack of Transportation (Non-Medical): No  Physical Activity: Not on file  Stress: Not on file  Social Connections: Not on file  Intimate Partner Violence: Not At Risk (02/02/2018)   Humiliation, Afraid, Rape, and Kick questionnaire    Fear of Current or Ex-Partner: No    Emotionally Abused: No    Physically Abused: No    Sexually Abused: No    No Known Allergies  No current facility-administered medications on file prior to encounter.   Current Outpatient Medications on File Prior to Encounter  Medication Sig Dispense Refill   acetaminophen (TYLENOL) 325 MG tablet Take 2 tablets (650 mg total) by mouth every 4 (four) hours as needed (for pain scale < 4). 60 tablet 1   ferrous sulfate (FERROUSUL) 325 (65 FE) MG tablet Take 1 tablet (325 mg total) by mouth every other day. 60 tablet 1   ibuprofen (ADVIL) 600 MG tablet Take 1 tablet (600 mg total) by mouth every 6 (six) hours. 30 tablet 1   Prenatal Vit-Fe Fumarate-FA (PRENATAL MULTIVITAMIN) TABS tablet Take 1 tablet by mouth daily at 12 noon.       ROS Pertinent positives and negative per HPI, all others reviewed and negative  Physical Exam   BP 120/75 (BP Location: Right Arm)   Pulse  75   Temp 98.5 F (36.9 C) (Oral)   Resp 18   Ht 4\' 11"  (1.499 m)   Wt 54.3 kg   LMP  (Approximate)   SpO2 97%   Breastfeeding Yes   BMI 24.18 kg/m   Patient Vitals for the past 24 hrs:  BP Temp Temp src Pulse Resp SpO2 Height Weight  05/09/22 2157 120/75 98.5 F (36.9 C) Oral 75 18 97 % 4\' 11"  (1.499 m) 54.3 kg    Physical Exam ***  Cervical Exam    Bedside Ultrasound ***  My interpretation: ***  FHT Baseline ***, *** variability, ***accels, ***decels Toco: *** Cat: ***  Labs No results found for this or any previous visit (from the past 24 hour(s)).  Imaging No results found.  MAU Course  Procedures Lab Orders  No laboratory test(s) ordered today   Meds ordered this encounter  Medications    oxyCODONE (Oxy IR/ROXICODONE) immediate release tablet 5 mg   acetaminophen (TYLENOL) tablet 1,000 mg   coconut oil   Imaging Orders  No imaging studies ordered today    MDM {Desc; none/mild/moderate/severe:13498}  Assessment and Plan  ***  #FWB FHT Cat *** NST: ***   Dispo: discharged to home in stable condition.***  ***add GA to billing    Khristi Schiller J Jamail Cullers, MD/MPH 05/09/22 11:48 PM  Allergies as of 05/09/2022   No Known Allergies   Med Rec must be completed prior to using this [facility]***

## 2022-05-09 NOTE — Telephone Encounter (Addendum)
Patient reports feeling good. "Things have been fine until today. I have swelling in my left breast and nipple. I'm engorged and in a lot of pain. My husband is on the way so he can take me to the hospital to get some help." RN gave patient information on how to get to [facility] MAU. RN also gave patient information about our [facility] resources. Patient declines other questions/concerns about her health and healing.  Patient reports that baby is doing well. Eating, peeing/pooping, and gaining weight well. Baby sleeps in a crib. RN reviewed ABC's of safe sleep with patient. Patient declines any questions or concerns about baby.  EPDS score is 1.  Marcelino Duster [facility]  05/09/22,1630

## 2022-05-09 NOTE — MAU Note (Signed)
.  [name] [name] is a 31 y.o. at [facility] Vag 8 days here in MAU reporting: left breast pain around her areola after breastfeeding baby, pt stated baby latched incorrectly 4 days ago. Pt states volume of breast milk decreased, pt under [facility] program and called lactation resource. Pt stated taking Motrin at home, but denies using any lactation ointments for the area. Pt denies any other PP c/o.   Onset of complaint: 4 days ago Pain score: 6/10 Vitals:   05/09/22 2157  BP: 120/75  Pulse: 75  Resp: 18  Temp: 98.5 F (36.9 C)  SpO2: 97%      Lab orders placed from triage:

## 2022-05-10 ENCOUNTER — Encounter (HOSPITAL_COMMUNITY): Payer: Self-pay | Admitting: Family Medicine

## 2024-09-29 ENCOUNTER — Encounter: Payer: Self-pay | Admitting: Physician Assistant

## 2024-09-29 ENCOUNTER — Ambulatory Visit (INDEPENDENT_AMBULATORY_CARE_PROVIDER_SITE_OTHER): Payer: Self-pay | Admitting: Physician Assistant

## 2024-09-29 VITALS — BP 112/76 | HR 70 | Temp 98.1°F | Ht 59.45 in | Wt 127.4 lb

## 2024-09-29 DIAGNOSIS — L309 Dermatitis, unspecified: Secondary | ICD-10-CM

## 2024-09-29 DIAGNOSIS — N939 Abnormal uterine and vaginal bleeding, unspecified: Secondary | ICD-10-CM

## 2024-09-29 DIAGNOSIS — L299 Pruritus, unspecified: Secondary | ICD-10-CM

## 2024-09-29 MED ORDER — TRIAMCINOLONE ACETONIDE 0.5 % EX CREA: 1.0000 | TOPICAL_CREAM | Freq: Two times a day (BID) | CUTANEOUS | 0 refills | Status: AC

## 2024-09-29 NOTE — Progress Notes (Signed)
 "   Patient ID: [name] [name], female    DOB: 06-Sep-1991, 34 y.o.   MRN: [id number]   Assessment & Plan:  Abnormal uterine bleeding -     US  PELVIC COMPLETE WITH TRANSVAGINAL; Future  Dermatitis  Itching  Other orders -     Triamcinolone Acetonide; Apply 1 Application topically 2 (two) times daily. Apply thin layer to affected area no longer than 2 weeks in duration, then sparingly.  Dispense: 45 g; Refill: 0      Assessment and Plan Assessment & Plan Localized dermatitis of pubic region Localized dermatitis of the pubic region, primarily on the right side, characterized by thickened, irritated, and dry skin with evidence of scratching and itching. Differential diagnosis includes eczema, possibly exacerbated by moisture and scratching. No discharge or internal vaginal involvement. - Prescribed triamcinolone cream 0.5% to apply to the affected area 2-3 times daily for 2 weeks. - Advised mixing the steroid cream with a thicker ointment like Eucerin, coconut oil, or cocoa butter to enhance soothing effect. - Instructed to wash hands after application. - Advised follow-up if no improvement in a few weeks.  Abnormal uterine bleeding Irregular menstrual cycles since IUD removal in July, with episodes of spotting and clotting. No regular periods since then. Concerns about potential internal issues, but no current pregnancy. Previous referral for pelvic ultrasound not completed due to financial concerns. - Ordered pelvic ultrasound to evaluate for potential cysts or other abnormalities. - Advised discussing financial concerns with the ultrasound provider to explore payment options. - Plan for follow-up in a few months for physical exam, blood work, and pelvic exam.      Return in about 3 months (around 12/28/2024) for physical, fasting labs , well woman.    Subjective:    Chief Complaint  Patient presents with   New Patient (Initial Visit)    New pt in office to est care  with PCP; [facility] provider [facility] and needing to est care in an office; pt has concerns she would like to discuss regarding; pt states that for past 3 weeks, is having discomfort in vaginal area and very itchy; no burning when urinating, pt states she has something that looks like dandruff in pubic hair;     HPI Discussed the use of AI scribe software for clinical note transcription with the patient, who gave verbal consent to proceed.  History of Present Illness [name] [name] is a 34 year old female who presents with vaginal discomfort and irregular menstrual cycles.   Due to language barrier, an interpreter was present during the history-taking and subsequent discussion (and for part of the physical exam) with this patient.   She has been experiencing discomfort and itchiness in the vaginal area, particularly on the right side, for the past three weeks. She has been using creams for vaginal infections, which help control the itchiness but do not reduce the flakiness or irritation. No vaginal discharge is present. She has no history of skin conditions like psoriasis or eczema but does have a history of dandruff on her scalp.  Her menstrual cycles have been irregular since the removal of an IUD in July of the previous year. She has not had a regular period, experiencing only two days of blood clots each month, with the timing of these episodes shifting by about five days earlier each month. Her last normal period was in July. She is not on birth control and denies any concern for pregnancy.  Past Medical History:  Diagnosis Date   Frequent headaches    Hyperlipemia    Vitamin D deficiency     Past Surgical History:  Procedure Laterality Date   DILATION AND CURETTAGE OF UTERUS  2009   ?for something similar to a cyst per patient.    Family History  Problem Relation Age of Onset   Fibrocystic breast disease Mother    Heart disease Father        rhythm issues     Social History[1]   Allergies[2]  Review of Systems NEGATIVE UNLESS OTHERWISE INDICATED IN HPI      Objective:     BP 112/76 (BP Location: Left Arm, Patient Position: Sitting, Cuff Size: Normal)   Pulse 70   Temp 98.1 F (36.7 C) (Temporal)   Ht 4' 11.45 (1.51 m)   Wt 127 lb 6.4 oz (57.8 kg)   LMP  (LMP Unknown)   SpO2 98%   Breastfeeding No   BMI 25.34 kg/m   Wt Readings from Last 3 Encounters:  09/29/24 127 lb 6.4 oz (57.8 kg)  05/09/22 119 lb 11.2 oz (54.3 kg)  05/02/22 129 lb 6.4 oz (58.7 kg)    BP Readings from Last 3 Encounters:  09/29/24 112/76  05/09/22 120/66  05/03/22 97/66     Physical Exam Vitals and nursing note reviewed. Exam conducted with a chaperone present.  Constitutional:      Appearance: Normal appearance.  Eyes:     Extraocular Movements: Extraocular movements intact.     Conjunctiva/sclera: Conjunctivae normal.     Pupils: Pupils are equal, round, and reactive to light.  Cardiovascular:     Rate and Rhythm: Normal rate.  Pulmonary:     Effort: Pulmonary effort is normal.  Skin:    Comments: R sided mons pubis excoriation, thickened skin, dry patch  Neurological:     General: No focal deficit present.     Mental Status: She is alert.  Psychiatric:        Mood and Affect: Mood normal.             [name] Cochrane M [place] Freshour, PA-C     [1]  Social History Tobacco Use   Smoking status: Never   Smokeless tobacco: Never  Vaping Use   Vaping status: Never Used  Substance Use Topics   Alcohol use: Yes    Comment: sometimes   Drug use: No  [2] No Known Allergies  "

## 2024-09-29 NOTE — Patient Instructions (Addendum)
 RESUMEN DE LA CONSULTA: Hoy se le atendi por molestias vaginales y ciclos menstruales irregulares. Ha presentado picazn e irritacin en la zona vaginal durante las ltimas tres semanas y ha tenido periodos irregulares desde la extraccin de su DIU en julio del ao pasado.  PLAN DE TRATAMIENTO: DERMATITIS LOCALIZADA EN LA REGIN PBICA: Presenta piel engrosada, irritada y seca en la regin pbica, principalmente en el lado derecho, sin afectacin vaginal interna. - Aplique crema de triamcinolona al 0.5% en la zona afectada 2 o 3 veces al da durante 2 semanas. - Mezcle la crema con un ungento ms espeso, como Eucerin, aceite de coco o manteca de cacao, para stage manager. - Lvese las manos despus de aplicar la crema. - Si no observa mejora en unas semanas, acuda a una consulta de seguimiento.  SANGRADO UTERINO ANORMAL: Ha presentado ciclos menstruales irregulares con episodios de manchado y cogulos desde la extraccin de su DIU. - Se ha solicitado una ecografa plvica para descartar posibles quistes u otras anomalas. - Consulte con el centro de ecografa sobre las opciones de pago disponibles. - Programe una cita de seguimiento en unos meses para un examen fsico, anlisis de sangre y examen plvico.    VISIT SUMMARY: Today, you were seen for vaginal discomfort and irregular menstrual cycles. You have been experiencing itchiness and irritation in the vaginal area for the past three weeks and have had irregular periods since the removal of your IUD in July of last year.  YOUR PLAN: LOCALIZED DERMATITIS OF PUBIC REGION: You have thickened, irritated, and dry skin in the pubic region, primarily on the right side, with no internal vaginal involvement. -Apply triamcinolone cream 0.5% to the affected area 2-3 times daily for 2 weeks. -Mix the steroid cream with a thicker ointment like Eucerin, coconut oil, or cocoa butter to enhance the soothing effect. -Wash your hands after  applying the cream. -Follow up if there is no improvement in a few weeks.  ABNORMAL UTERINE BLEEDING: You have been experiencing irregular menstrual cycles with episodes of spotting and clotting since the removal of your IUD. -A pelvic ultrasound has been ordered to check for potential cysts or other abnormalities. -Discuss financial concerns with the ultrasound provider to explore payment options. -Plan for a follow-up in a few months for a physical exam, blood work, and pelvic exam.    Contains text generated by [employer].

## 2025-01-09 ENCOUNTER — Encounter: Payer: Self-pay | Admitting: Physician Assistant
